# Patient Record
Sex: Male | Born: 1955 | Race: Black or African American | Hispanic: No | Marital: Single | Smoking: Never smoker
Health system: Southern US, Community
[De-identification: ages and names within clinical notes are randomized; demographics above are authoritative.]

## PROBLEM LIST (undated history)

## (undated) DIAGNOSIS — R972 Elevated prostate specific antigen [PSA]: Secondary | ICD-10-CM

## (undated) DIAGNOSIS — N189 Chronic kidney disease, unspecified: Secondary | ICD-10-CM

## (undated) DIAGNOSIS — M1990 Unspecified osteoarthritis, unspecified site: Secondary | ICD-10-CM

## (undated) DIAGNOSIS — Z8489 Family history of other specified conditions: Secondary | ICD-10-CM

## (undated) DIAGNOSIS — C801 Malignant (primary) neoplasm, unspecified: Secondary | ICD-10-CM

## (undated) DIAGNOSIS — E785 Hyperlipidemia, unspecified: Secondary | ICD-10-CM

## (undated) DIAGNOSIS — Z87442 Personal history of urinary calculi: Secondary | ICD-10-CM

## (undated) DIAGNOSIS — I10 Essential (primary) hypertension: Secondary | ICD-10-CM

## (undated) DIAGNOSIS — R7303 Prediabetes: Secondary | ICD-10-CM

## (undated) DIAGNOSIS — E669 Obesity, unspecified: Secondary | ICD-10-CM

## (undated) HISTORY — PX: OTHER SURGICAL HISTORY: SHX169

## (undated) HISTORY — PX: PROSTATE BIOPSY: SHX241

## (undated) HISTORY — PX: TONSILLECTOMY: SUR1361

## (undated) HISTORY — DX: Essential (primary) hypertension: I10

---

## 2008-12-13 DIAGNOSIS — K759 Inflammatory liver disease, unspecified: Secondary | ICD-10-CM

## 2008-12-13 HISTORY — DX: Inflammatory liver disease, unspecified: K75.9

## 2008-12-13 HISTORY — PX: SHOULDER SURGERY: SHX246

## 2010-04-23 ENCOUNTER — Emergency Department (HOSPITAL_COMMUNITY): Admission: EM | Admit: 2010-04-23 | Discharge: 2010-04-23 | Payer: Self-pay | Admitting: Emergency Medicine

## 2012-12-13 HISTORY — PX: OTHER SURGICAL HISTORY: SHX169

## 2013-10-12 ENCOUNTER — Encounter (HOSPITAL_COMMUNITY): Payer: Self-pay | Admitting: Emergency Medicine

## 2013-10-12 ENCOUNTER — Emergency Department (HOSPITAL_COMMUNITY)
Admission: EM | Admit: 2013-10-12 | Discharge: 2013-10-12 | Disposition: A | Discharge status: Home / Self Care | Payer: Managed Care, Other (non HMO) | Source: Home / Self Care | Attending: Family Medicine | Admitting: Family Medicine

## 2013-10-12 DIAGNOSIS — S43499A Other sprain of unspecified shoulder joint, initial encounter: Secondary | ICD-10-CM

## 2013-10-12 DIAGNOSIS — S46811A Strain of other muscles, fascia and tendons at shoulder and upper arm level, right arm, initial encounter: Secondary | ICD-10-CM

## 2013-10-12 MED ORDER — CYCLOBENZAPRINE HCL 5 MG PO TABS: 5.0000 mg | ORAL_TABLET | Freq: Three times a day (TID) | ORAL | Status: DC | PRN

## 2013-10-12 MED ORDER — TRAMADOL HCL 50 MG PO TABS: 50.0000 mg | ORAL_TABLET | Freq: Four times a day (QID) | ORAL | Status: DC | PRN

## 2013-10-12 NOTE — ED Notes (Signed)
C/o neck and shoulder pain which started Tuesday night/wednesday morning.  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  Patient did take aleve and used icy hot but has had no relief.

## 2013-10-12 NOTE — ED Provider Notes (Signed)
CSN: [id number]     Arrival date & time 10/12/13  1114 History   First MD Initiated Contact with Patient 10/12/13 1241     Chief Complaint  Patient presents with  . Neck Pain  . Shoulder Pain   (Consider location/radiation/quality/duration/timing/severity/associated sxs/prior Treatment) Patient is a 57 y.o. male presenting with neck pain and shoulder pain. The history is provided by the patient and the spouse.  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 Worsened by:  Position Associated symptoms: no headaches and no weakness   Shoulder Pain Pertinent negatives include no headaches.    History reviewed. No pertinent past medical history. No past surgical history on file. No family history on file. History  Substance Use Topics  . Smoking status: Not on file  . Smokeless tobacco: Not on file  . Alcohol Use: Not on file    Review of Systems  Constitutional: Negative.   Musculoskeletal: Positive for back pain and neck pain.  Skin: Negative.   Neurological: Negative for weakness and headaches.    Allergies  Review of patient's allergies indicates no known allergies.  Home Medications   Current Outpatient Rx  Name  Route  Sig  Dispense  Refill  . cyclobenzaprine (FLEXERIL) 5 MG tablet   Oral   Take 1 tablet (5 mg total) by mouth 3 (three) times daily as needed for muscle spasms.   30 tablet   0   . traMADol (ULTRAM) 50 MG tablet   Oral   Take 1 tablet (50 mg total) by mouth every 6 (six) hours as needed for pain.   15 tablet   0    BP 165/92  Pulse 76  Temp(Src) 98.2 F (36.8 C) (Oral)  Resp 18  SpO2 97% Physical Exam  Nursing note and vitals reviewed. Constitutional: He is oriented to person, place, and time. He appears  well-developed and well-nourished.  Musculoskeletal: He exhibits tenderness.       Arms: Neurological: He is alert and oriented to person, place, and time.    ED Course  Procedures (including critical care time) Labs Review Labs Reviewed - No data to display Imaging Review No results found.    MDM      Linna Hoff, MD 10/12/13 [phone]

## 2014-08-17 IMAGING — CR DG CERVICAL SPINE COMPLETE 4+V
7 series · 7 of 7 positions shown · non-contrast
Comparison: None.

CLINICAL DATA: Neck pain.

EXAM:
CERVICAL SPINE  4+ VIEWS

[lpo]
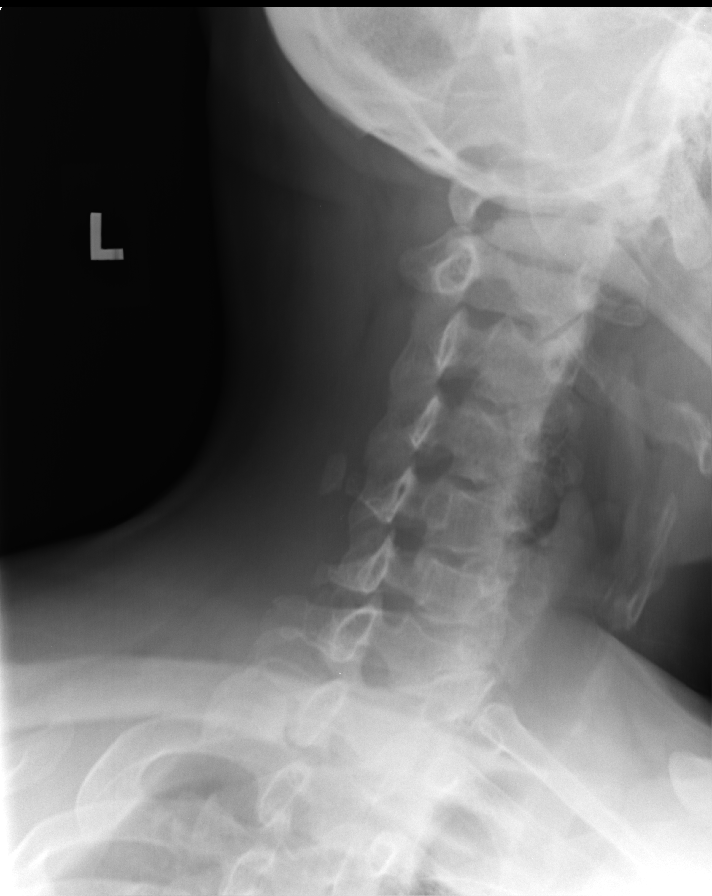

[lateral]
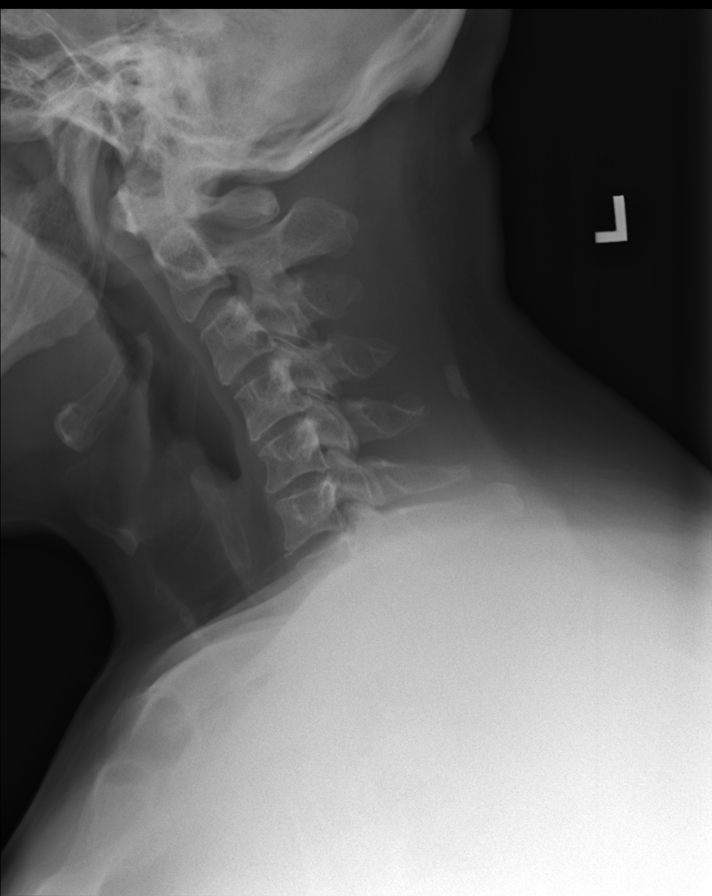

[rpo]
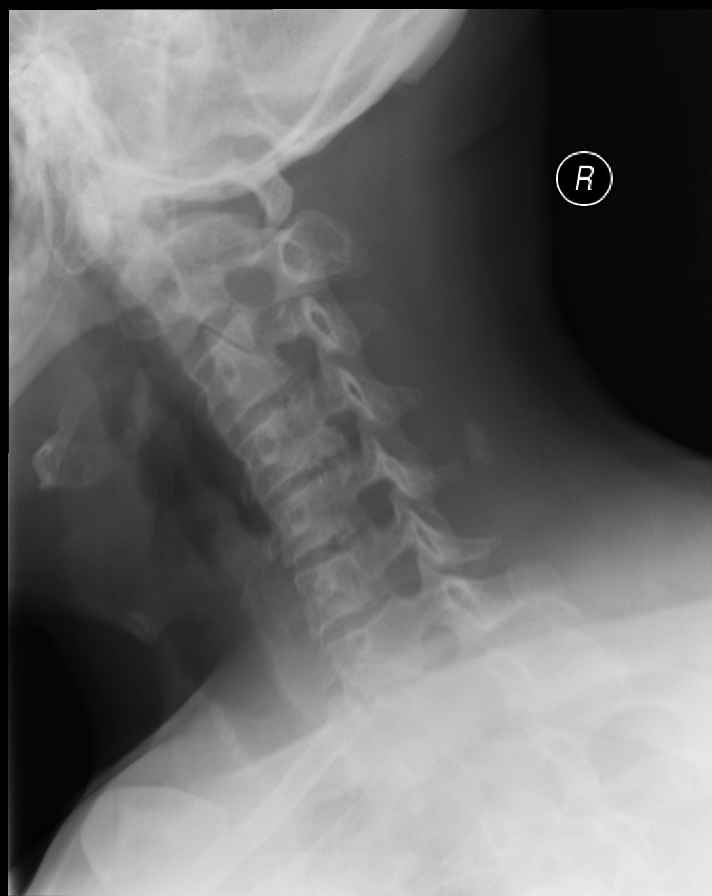

[AP]
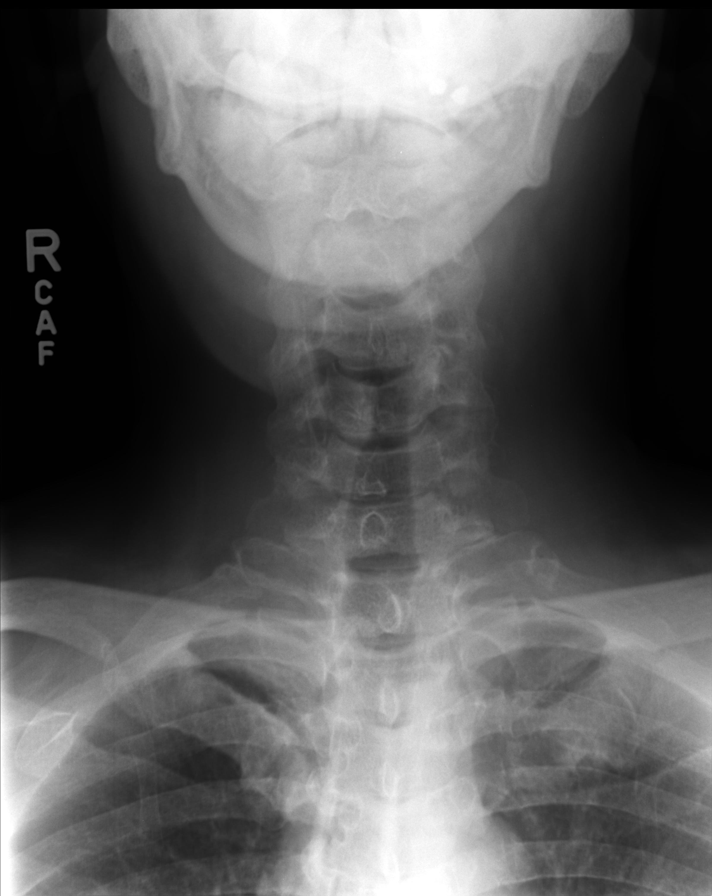

[ap open mouth (1 of 2)]
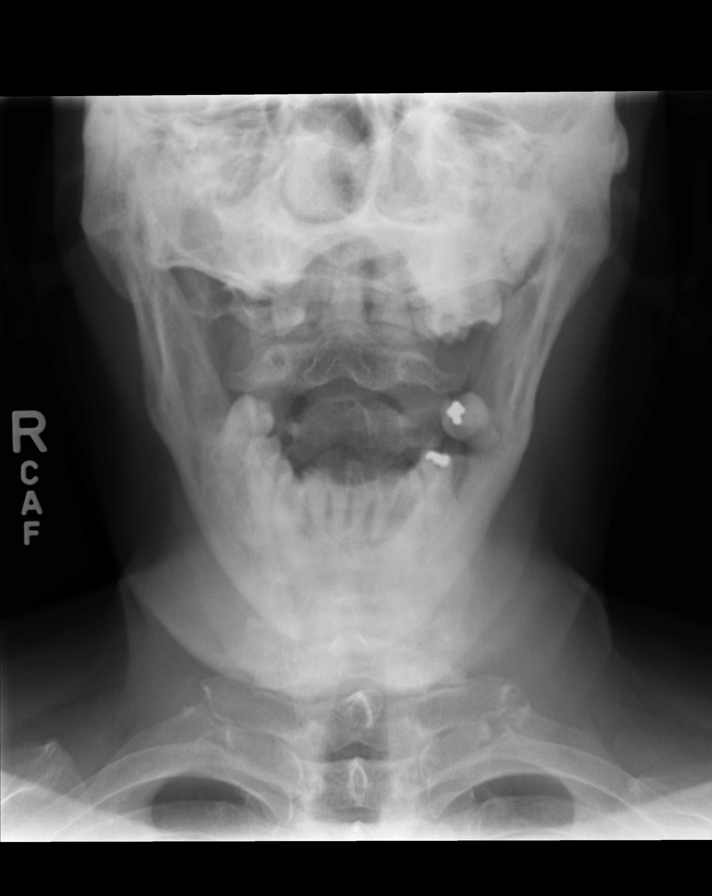

[swimmers]
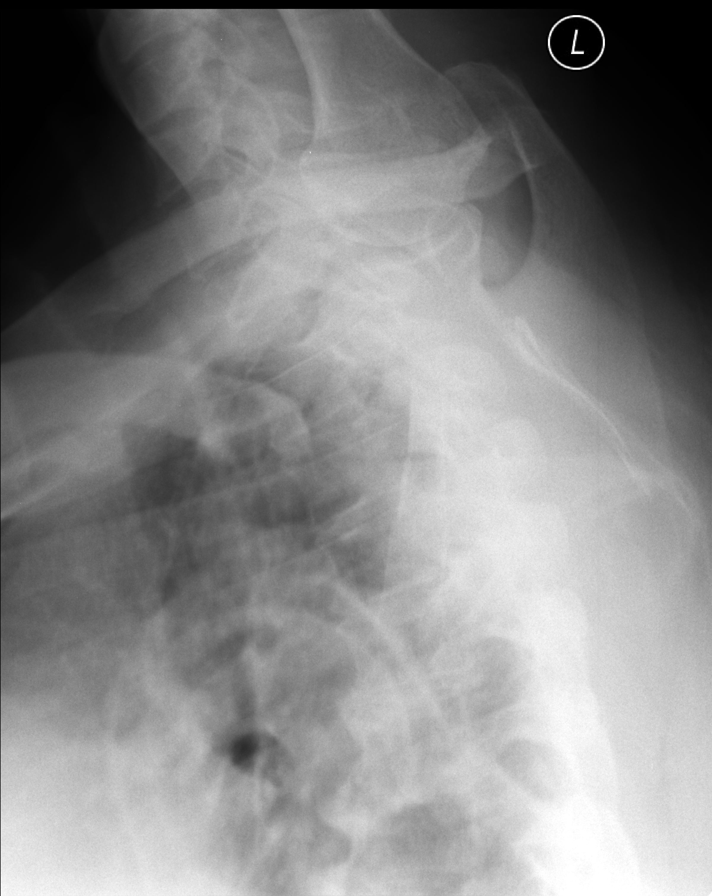

[ap open mouth (2 of 2)]
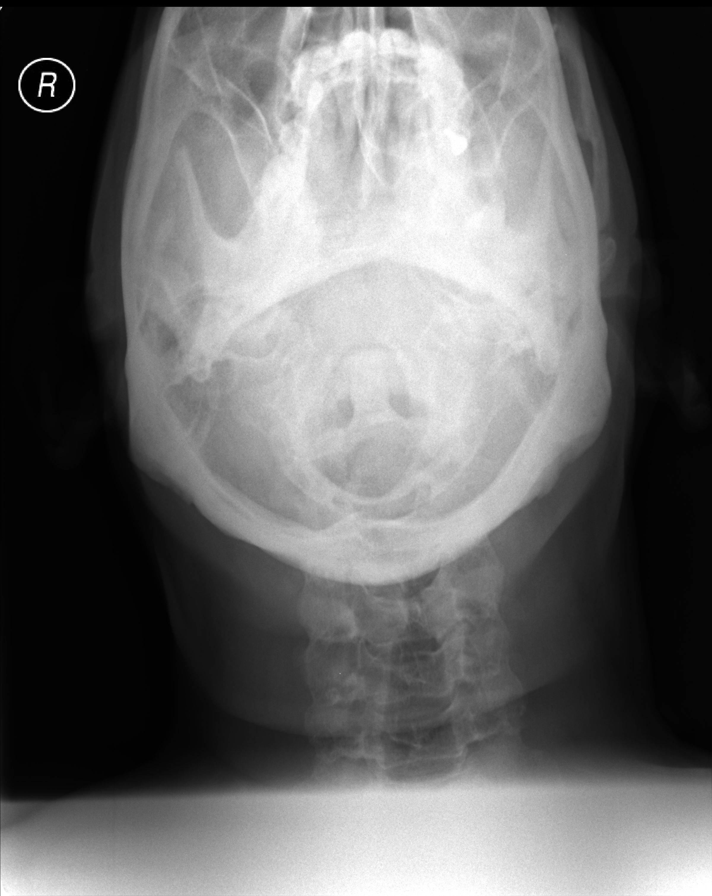

[7 of 7 positions shown; findings below may reference images not displayed]

FINDINGS: Reversal of the normal cervical lordosis centered around C4-C5.
Calcification the nuchal ligament is incidentally noted. Disc space
narrowing at C4-C5 and C5-C6. Mild disk degeneration at C6-C7. There
is no fracture identified. C4-C5 right foraminal stenosis associated
with uncovertebral spurring. Cervicothoracic junction is
suboptimally visualized but grossly appears within normal limits.
IMPRESSION: Moderate mid cervical spondylosis.

## 2014-08-17 IMAGING — CR DG SHOULDER 2+V*R*
3 series · 3 of 3 positions shown · non-contrast
Comparison: None.

CLINICAL DATA: Right shoulder posterior pain.

EXAM:
RIGHT SHOULDER - 2+ VIEW

[AP]
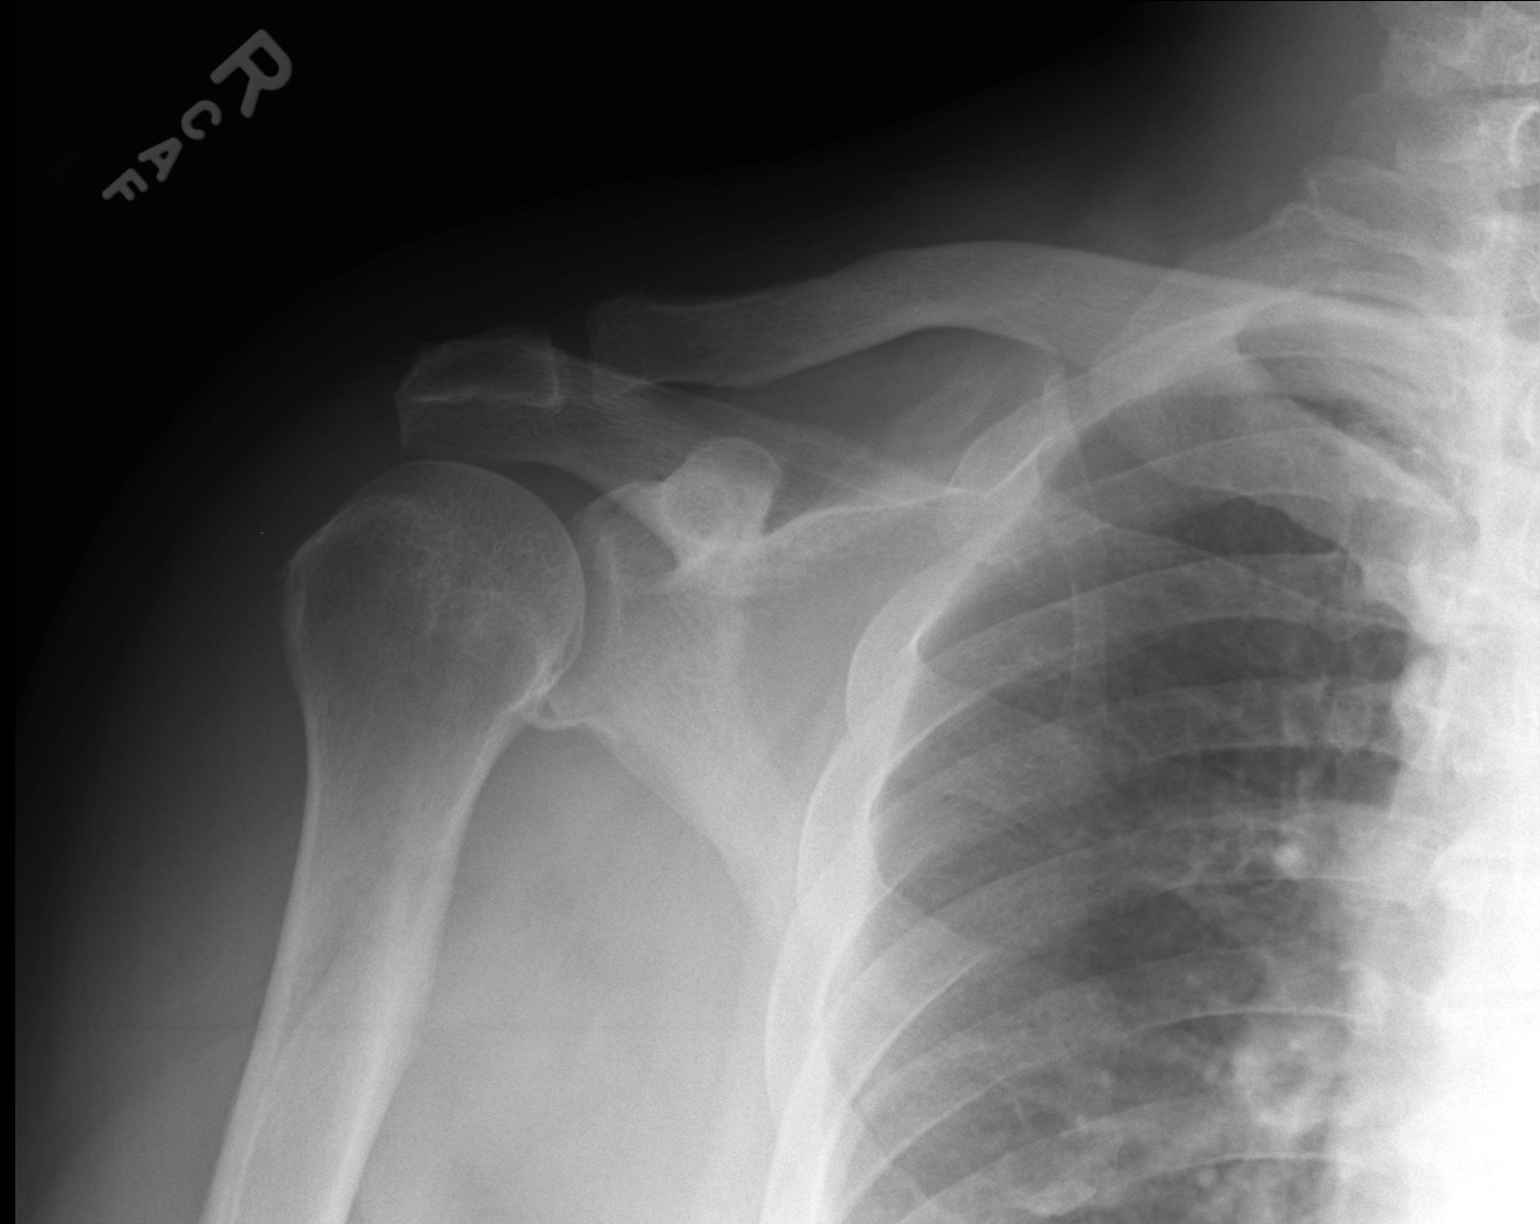

[ap ext rot]
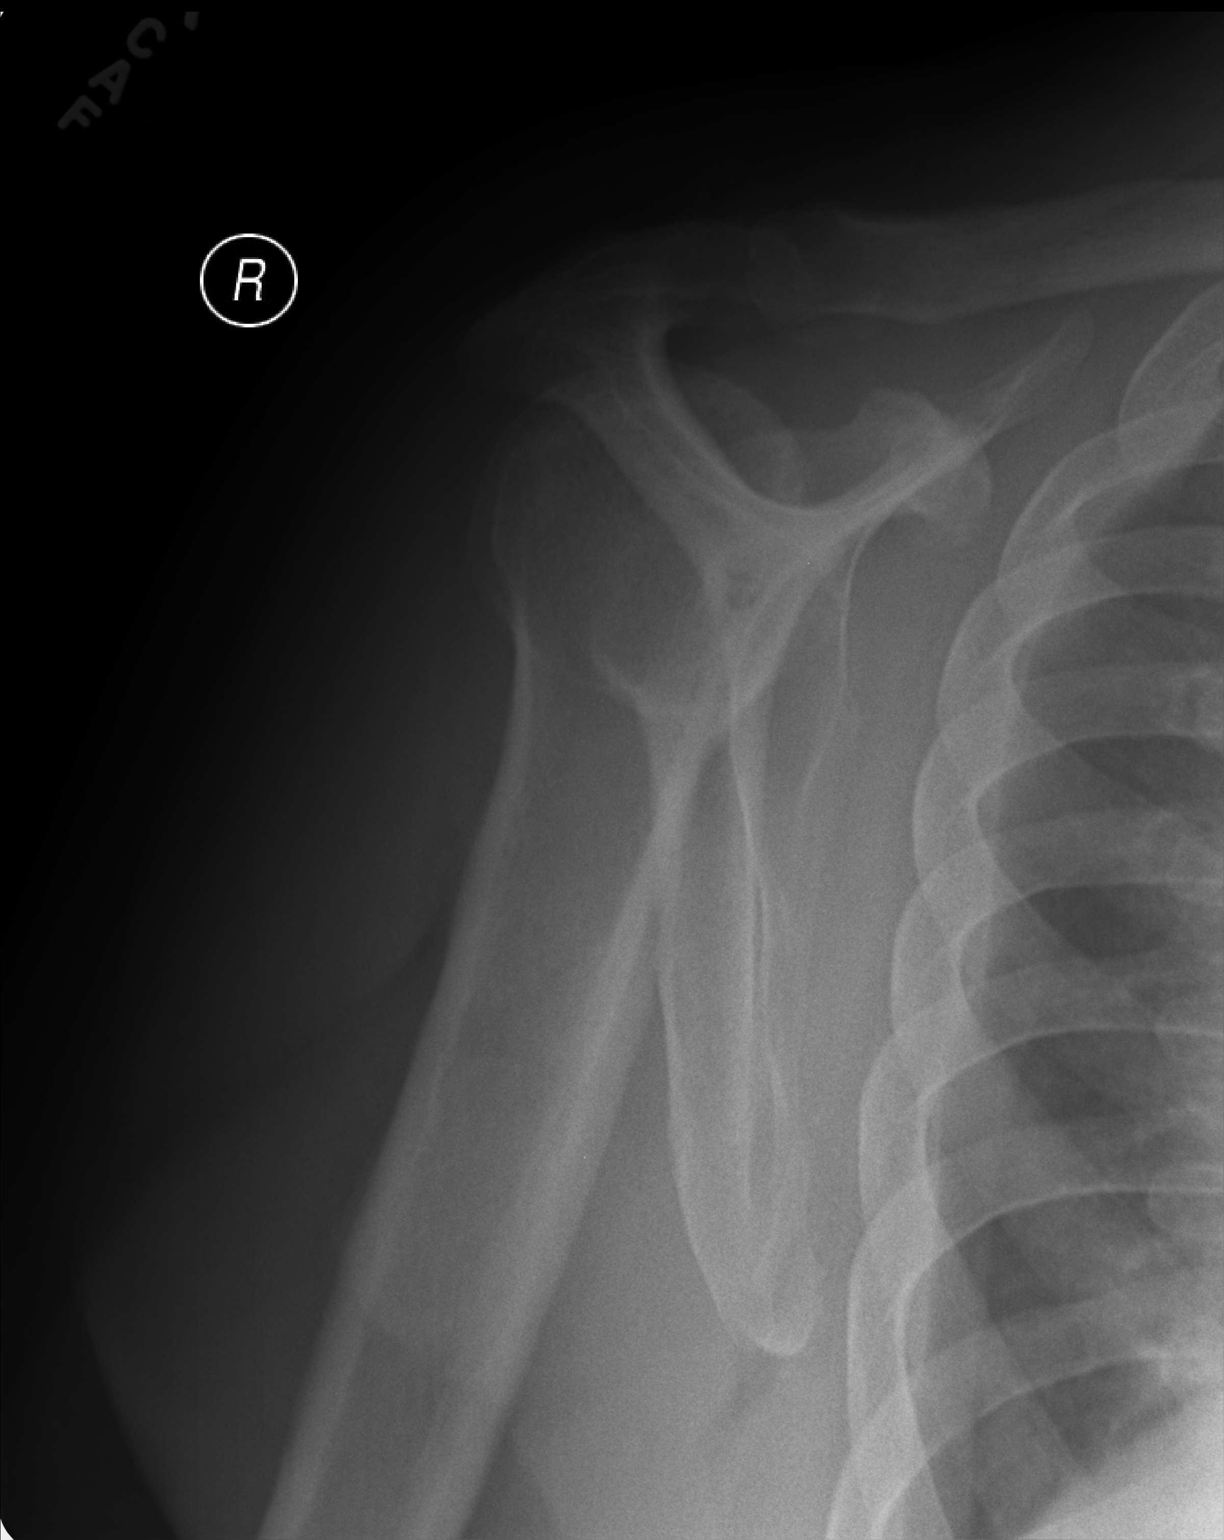

[ap int rot]
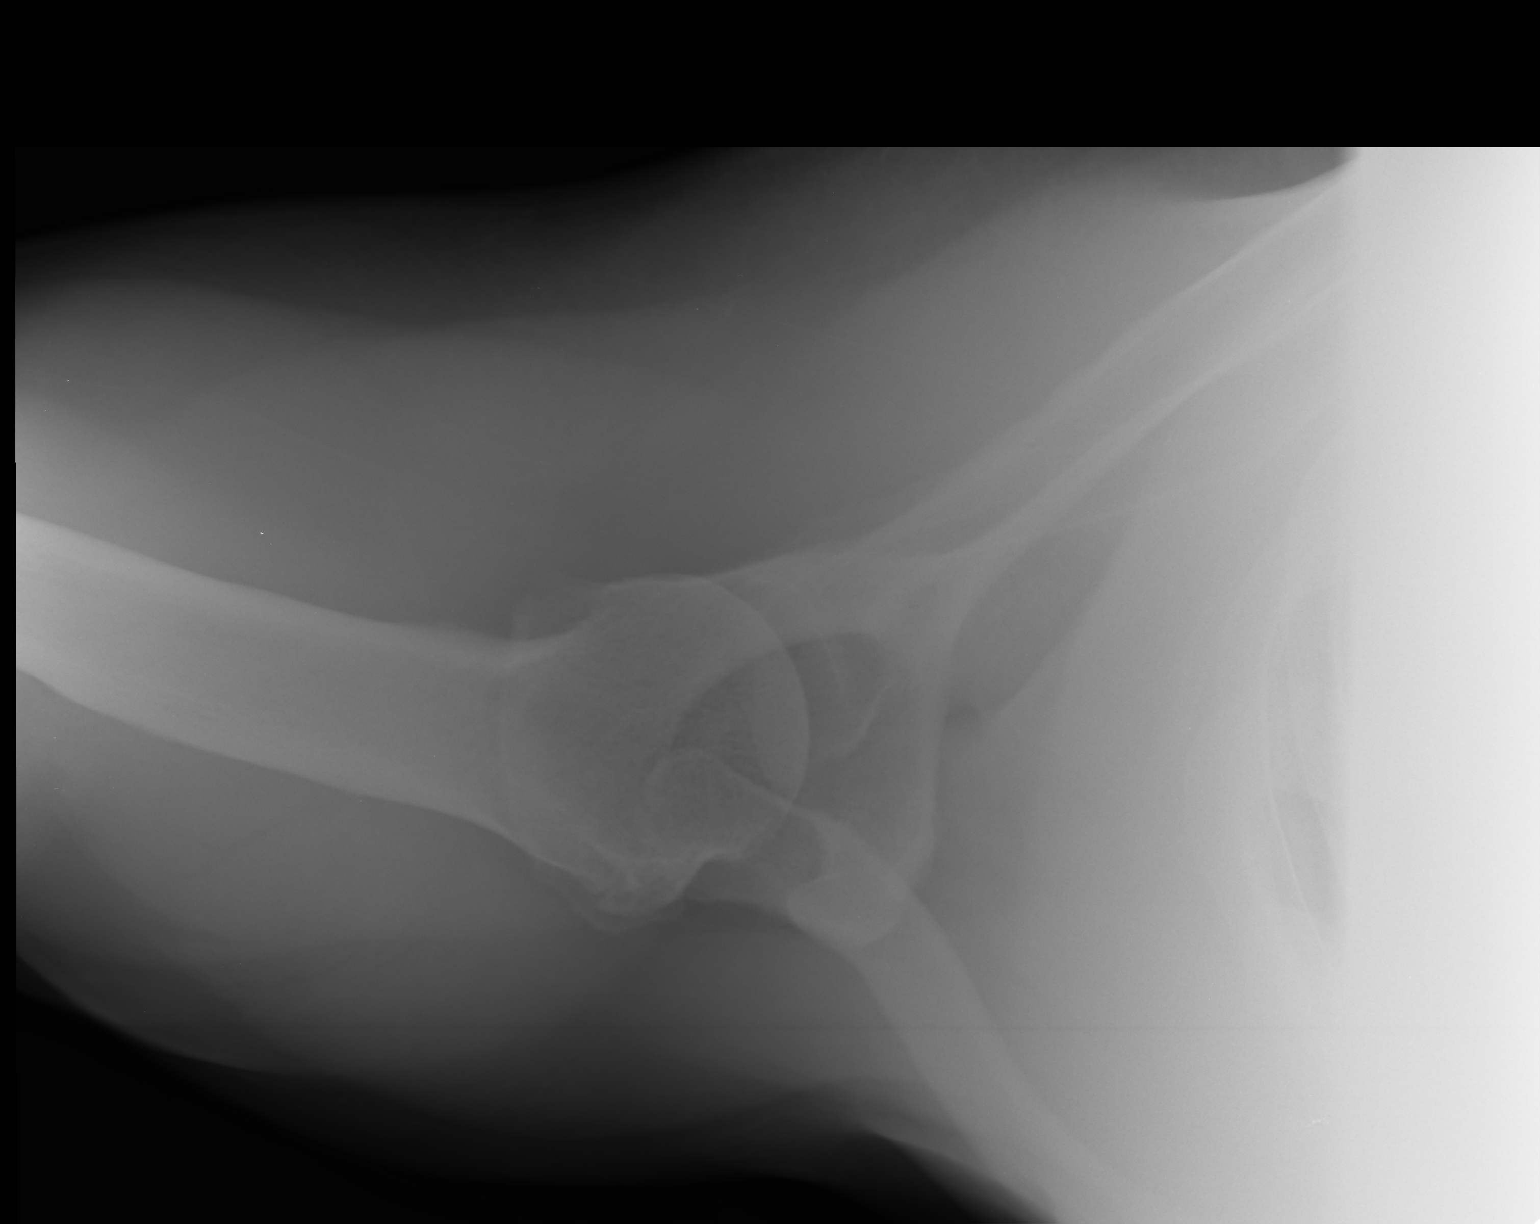

[3 of 3 positions shown; findings below may reference images not displayed]

FINDINGS: There is no evidence of fracture or dislocation. No significant
focal bony abnormality. Soft tissues are unremarkable.
IMPRESSION: Negative.

## 2015-05-02 ENCOUNTER — Ambulatory Visit (INDEPENDENT_AMBULATORY_CARE_PROVIDER_SITE_OTHER): Payer: Worker's Compensation | Admitting: Emergency Medicine

## 2015-05-02 ENCOUNTER — Ambulatory Visit: Payer: Worker's Compensation

## 2015-05-02 VITALS — BP 150/90 | HR 100 | Temp 97.9°F | Resp 18 | Ht 69.5 in | Wt 241.2 lb

## 2015-05-02 DIAGNOSIS — M25512 Pain in left shoulder: Secondary | ICD-10-CM

## 2015-05-02 NOTE — Progress Notes (Addendum)
   Subjective:    Patient ID: Joseph Cox, male    DOB: 1956/05/05, 59 y.o.   MRN: [id number]  This chart was scribed for Collene GobbleSteven A Aldeen Riga, MD by Ronney LionSuzanne Le, ED Scribe. This patient was seen in room 13 and the patient's care was started at 6:08 PM.  HPI   Chief Complaint  Patient presents with  . Shoulder Pain    Left shoulder, fell at work back on march,     HPI Comments: Joseph Cox is a 59 y.o. male who presents to the [facility] complaining of constant left shoulder pain that began about 2 months ago after patient slipped at work on the wet floor coming out if the men's restroom and fell directly on his left shoulder. Patient states that he reported this injury this morning; he had reported the injury earlier, but his boss did not seem to care. He decided to see if his shoulder would improve with time, but it has not gotten any better. He has not had any prior treatment for this. 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 He notes he has a history of surgery on his right shoulder that was performed by Dr. Ave Filterhandler.   Review of Systems  Musculoskeletal: Positive for arthralgias.      Objective:   Physical Exam  Nursing note and vitals reviewed. CONSTITUTIONAL: Well developed/well nourished HEAD: Normocephalic/atraumatic EYES: EOMI/PERRL ENMT: Mucous membranes moist NECK: supple no meningeal signs SPINE/BACK:entire spine nontender CV: S1/S2 noted, no murmurs/rubs/gallops noted LUNGS: Lungs are clear to auscultation bilaterally, no apparent distress ABDOMEN: soft, nontender, no rebound or guarding, bowel sounds noted throughout abdomen GU:no cva tenderness NEURO: Pt is awake/alert/appropriate, moves all extremitiesx4.  No facial droop.   EXTREMITIES: left shoulder - very limited internal/external rotation. Weak external rotation of the left shoulder. Weak empty can test of left arm. Good grip strength.  SKIN: warm, color normal PSYCH: no abnormalities of mood  noted, alert and oriented to situation  [facility] reading (PRIMARY) by  Dr. Cleta Albertsaub No fracture     Assessment & Plan:  Referral made to Dr. Ave Filterhandler. Referral made for MRI of the shoulder. I do suspect he has a rotator cuff tear. He was placed on work restrictions. No medications were given this is a Teacher, adult educationWorker's Comp. Case.I personally performed the services described in this documentation, which was scribed in my presence. The recorded information has been reviewed and is accurate. His restriction is no lifting over 20 pounds no lifting over shoulder height  Earl LitesSteve Yoandri Congrove, MD

## 2015-07-29 ENCOUNTER — Ambulatory Visit (INDEPENDENT_AMBULATORY_CARE_PROVIDER_SITE_OTHER): Payer: Managed Care, Other (non HMO) | Admitting: Emergency Medicine

## 2015-07-29 VITALS — BP 110/76 | HR 80 | Temp 98.3°F | Resp 16 | Ht 69.5 in | Wt 233.0 lb

## 2015-07-29 DIAGNOSIS — M25512 Pain in left shoulder: Secondary | ICD-10-CM | POA: Diagnosis not present

## 2015-07-29 MED ORDER — NAPROXEN SODIUM 550 MG PO TABS
550.0000 mg | ORAL_TABLET | Freq: Two times a day (BID) | ORAL | Status: AC
Start: 2015-07-29 — End: 2016-07-28

## 2015-07-29 NOTE — Progress Notes (Signed)
Subjective:  Patient ID: Joseph Cox, male    DOB: Jan 30, 1956  Age: 59 y.o. MRN: [id number]  CC: Follow-up   HPI Joseph Cox presents  with an injury to his back after slipping on a wet floor at work. He did report the injury to his employer for several months and he was seen here for the injury by Dr. Cleta Alberts. Was put on limited duty. His Workmen's Comp. claim is been denied presumably due to the interval between the injury and reporting. In any event he needs a return to work authorization. He's been working but he needs to have the medical note to close his case. He has little pain in his shoulder and will be having an MRI accomplished to evaluate his injury.  History Joseph Cox has a past medical history of Hypertension.   He has past surgical history that includes shoulder surgery (Left).   His  family history includes Diabetes in his father, mother, and sister; Hypertension in his father and sister.  He   reports that he has never smoked. He does not have any smokeless tobacco history on file. He reports that he does not drink alcohol. His drug history is not on file.  Outpatient Prescriptions Prior to Visit  Medication Sig Dispense Refill  . VALSARTAN PO Take 25 mg by mouth.    . cyclobenzaprine (FLEXERIL) 5 MG tablet Take 1 tablet (5 mg total) by mouth 3 (three) times daily as needed for muscle spasms. (Patient not taking: Reported on 05/02/2015) 30 tablet 0  . traMADol (ULTRAM) 50 MG tablet Take 1 tablet (50 mg total) by mouth every 6 (six) hours as needed for pain. (Patient not taking: Reported on 05/02/2015) 15 tablet 0   No facility-administered medications prior to visit.    Social History   Social History  . Marital Status: Single    Spouse Name: N/A  . Number of Children: N/A  . Years of Education: N/A   Social History Main Topics  . Smoking status: Never Smoker   . Smokeless tobacco: None  . Alcohol Use: No  . Drug Use: None  . Sexual Activity: Not Asked    Other Topics Concern  . None   Social History Narrative     Review of Systems  Constitutional: Negative for fever, chills and appetite change.  HENT: Negative for congestion, ear pain, postnasal drip, sinus pressure and sore throat.   Eyes: Negative for pain and redness.  Respiratory: Negative for cough, shortness of breath and wheezing.   Cardiovascular: Negative for leg swelling.  Gastrointestinal: Negative for nausea, vomiting, abdominal pain, diarrhea, constipation and blood in stool.  Endocrine: Negative for polyuria.  Genitourinary: Negative for dysuria, urgency, frequency and flank pain.  Musculoskeletal: Negative for gait problem.  Skin: Negative for rash.  Neurological: Negative for weakness and headaches.  Psychiatric/Behavioral: Negative for confusion and decreased concentration. The patient is not nervous/anxious.     Objective:  BP 110/76 mmHg  Pulse 80  Temp(Src) 98.3 F (36.8 C) (Oral)  Resp 16  Ht 5' 9.5" (1.765 m)  Wt 233 lb (105.688 kg)  BMI 33.93 kg/m2  SpO2 97%  Physical Exam  Constitutional: He is oriented to person, place, and time. He appears well-developed and well-nourished.  HENT:  Head: Normocephalic and atraumatic.  Eyes: Conjunctivae are normal. Pupils are equal, round, and reactive to light.  Pulmonary/Chest: Effort normal.  Musculoskeletal: He exhibits no edema.       Left shoulder: He exhibits tenderness.  Neurological: He is alert and oriented to person, place, and time.  Skin: Skin is dry.  Psychiatric: He has a normal mood and affect. His behavior is normal. Thought content normal.      Assessment & Plan:   Joseph Cox was seen today for follow-up.  Diagnoses and all orders for this visit:  Pain in joint, shoulder region, left  Other orders -     naproxen sodium (ANAPROX DS) 550 MG tablet; Take 1 tablet (550 mg total) by mouth 2 (two) times daily with a meal.   I am having Joseph Cox start on naproxen sodium. I am also having  him maintain his traMADol, cyclobenzaprine, and VALSARTAN PO.  Meds ordered this encounter  Medications  . naproxen sodium (ANAPROX DS) 550 MG tablet    Sig: Take 1 tablet (550 mg total) by mouth 2 (two) times daily with a meal.    Dispense:  40 tablet    Refill:  0    Appropriate red flag conditions were discussed with the patient as well as actions that should be taken.  Patient expressed his understanding.  Follow-up: Return if symptoms worsen or fail to improve.  Carmelina Dane, MD

## 2015-07-29 NOTE — Patient Instructions (Signed)

## 2015-08-08 ENCOUNTER — Telehealth: Payer: Self-pay | Admitting: Family Medicine

## 2015-08-08 NOTE — Telephone Encounter (Signed)
Per Rosann Auerbach they did not approve for MRI. I spoke to patient and he went to [facility] on 07/24/2015 and saw Gerlene Burdock. Another MRI request was sent to [facility] and an Approval was received on 07/29/2015. He will continue care with [facility]. [employer] forms are sent for scan.

## 2018-09-26 DIAGNOSIS — Z0000 Encounter for general adult medical examination without abnormal findings: Secondary | ICD-10-CM | POA: Diagnosis not present

## 2018-09-26 DIAGNOSIS — E7849 Other hyperlipidemia: Secondary | ICD-10-CM | POA: Diagnosis not present

## 2018-09-26 DIAGNOSIS — R7301 Impaired fasting glucose: Secondary | ICD-10-CM | POA: Diagnosis not present

## 2018-09-26 DIAGNOSIS — E0590 Thyrotoxicosis, unspecified without thyrotoxic crisis or storm: Secondary | ICD-10-CM | POA: Diagnosis not present

## 2018-09-26 DIAGNOSIS — Z125 Encounter for screening for malignant neoplasm of prostate: Secondary | ICD-10-CM | POA: Diagnosis not present

## 2018-09-26 DIAGNOSIS — I10 Essential (primary) hypertension: Secondary | ICD-10-CM | POA: Diagnosis not present

## 2018-10-03 DIAGNOSIS — Z6836 Body mass index (BMI) 36.0-36.9, adult: Secondary | ICD-10-CM | POA: Diagnosis not present

## 2018-10-03 DIAGNOSIS — Z0000 Encounter for general adult medical examination without abnormal findings: Secondary | ICD-10-CM | POA: Diagnosis not present

## 2018-10-03 DIAGNOSIS — Z1389 Encounter for screening for other disorder: Secondary | ICD-10-CM | POA: Diagnosis not present

## 2018-10-03 DIAGNOSIS — N183 Chronic kidney disease, stage 3 (moderate): Secondary | ICD-10-CM | POA: Diagnosis not present

## 2018-10-03 DIAGNOSIS — R7301 Impaired fasting glucose: Secondary | ICD-10-CM | POA: Diagnosis not present

## 2018-10-03 DIAGNOSIS — M25512 Pain in left shoulder: Secondary | ICD-10-CM | POA: Diagnosis not present

## 2018-10-03 DIAGNOSIS — I10 Essential (primary) hypertension: Secondary | ICD-10-CM | POA: Diagnosis not present

## 2018-10-03 DIAGNOSIS — N390 Urinary tract infection, site not specified: Secondary | ICD-10-CM | POA: Diagnosis not present

## 2018-10-03 DIAGNOSIS — E7849 Other hyperlipidemia: Secondary | ICD-10-CM | POA: Diagnosis not present

## 2019-01-30 DIAGNOSIS — I10 Essential (primary) hypertension: Secondary | ICD-10-CM | POA: Diagnosis not present

## 2019-02-06 DIAGNOSIS — R7301 Impaired fasting glucose: Secondary | ICD-10-CM | POA: Diagnosis not present

## 2019-02-22 DIAGNOSIS — Z6836 Body mass index (BMI) 36.0-36.9, adult: Secondary | ICD-10-CM | POA: Diagnosis not present

## 2019-02-22 DIAGNOSIS — N183 Chronic kidney disease, stage 3 (moderate): Secondary | ICD-10-CM | POA: Diagnosis not present

## 2019-02-22 DIAGNOSIS — M1990 Unspecified osteoarthritis, unspecified site: Secondary | ICD-10-CM | POA: Diagnosis not present

## 2019-02-22 DIAGNOSIS — R7301 Impaired fasting glucose: Secondary | ICD-10-CM | POA: Diagnosis not present

## 2019-02-22 DIAGNOSIS — I10 Essential (primary) hypertension: Secondary | ICD-10-CM | POA: Diagnosis not present

## 2019-07-24 DIAGNOSIS — R7301 Impaired fasting glucose: Secondary | ICD-10-CM | POA: Diagnosis not present

## 2019-07-24 DIAGNOSIS — N183 Chronic kidney disease, stage 3 (moderate): Secondary | ICD-10-CM | POA: Diagnosis not present

## 2019-07-24 DIAGNOSIS — S46001D Unspecified injury of muscle(s) and tendon(s) of the rotator cuff of right shoulder, subsequent encounter: Secondary | ICD-10-CM | POA: Diagnosis not present

## 2019-07-24 DIAGNOSIS — I129 Hypertensive chronic kidney disease with stage 1 through stage 4 chronic kidney disease, or unspecified chronic kidney disease: Secondary | ICD-10-CM | POA: Diagnosis not present

## 2019-10-03 DIAGNOSIS — R7301 Impaired fasting glucose: Secondary | ICD-10-CM | POA: Diagnosis not present

## 2019-10-03 DIAGNOSIS — R82998 Other abnormal findings in urine: Secondary | ICD-10-CM | POA: Diagnosis not present

## 2019-10-03 DIAGNOSIS — E7849 Other hyperlipidemia: Secondary | ICD-10-CM | POA: Diagnosis not present

## 2019-10-03 DIAGNOSIS — Z125 Encounter for screening for malignant neoplasm of prostate: Secondary | ICD-10-CM | POA: Diagnosis not present

## 2019-10-10 DIAGNOSIS — I129 Hypertensive chronic kidney disease with stage 1 through stage 4 chronic kidney disease, or unspecified chronic kidney disease: Secondary | ICD-10-CM | POA: Diagnosis not present

## 2019-10-10 DIAGNOSIS — E785 Hyperlipidemia, unspecified: Secondary | ICD-10-CM | POA: Diagnosis not present

## 2019-10-10 DIAGNOSIS — M1990 Unspecified osteoarthritis, unspecified site: Secondary | ICD-10-CM | POA: Diagnosis not present

## 2019-10-10 DIAGNOSIS — Z0000 Encounter for general adult medical examination without abnormal findings: Secondary | ICD-10-CM | POA: Diagnosis not present

## 2019-10-10 DIAGNOSIS — N183 Chronic kidney disease, stage 3 unspecified: Secondary | ICD-10-CM | POA: Diagnosis not present

## 2019-10-10 DIAGNOSIS — R7301 Impaired fasting glucose: Secondary | ICD-10-CM | POA: Diagnosis not present

## 2019-10-10 DIAGNOSIS — R972 Elevated prostate specific antigen [PSA]: Secondary | ICD-10-CM | POA: Diagnosis not present

## 2019-10-10 DIAGNOSIS — Z1331 Encounter for screening for depression: Secondary | ICD-10-CM | POA: Diagnosis not present

## 2019-11-27 DIAGNOSIS — Z1212 Encounter for screening for malignant neoplasm of rectum: Secondary | ICD-10-CM | POA: Diagnosis not present

## 2020-02-14 DIAGNOSIS — N183 Chronic kidney disease, stage 3 unspecified: Secondary | ICD-10-CM | POA: Diagnosis not present

## 2020-02-14 DIAGNOSIS — E785 Hyperlipidemia, unspecified: Secondary | ICD-10-CM | POA: Diagnosis not present

## 2020-02-14 DIAGNOSIS — R972 Elevated prostate specific antigen [PSA]: Secondary | ICD-10-CM | POA: Diagnosis not present

## 2020-02-14 DIAGNOSIS — I129 Hypertensive chronic kidney disease with stage 1 through stage 4 chronic kidney disease, or unspecified chronic kidney disease: Secondary | ICD-10-CM | POA: Diagnosis not present

## 2020-02-14 DIAGNOSIS — M1990 Unspecified osteoarthritis, unspecified site: Secondary | ICD-10-CM | POA: Diagnosis not present

## 2020-02-14 DIAGNOSIS — R7301 Impaired fasting glucose: Secondary | ICD-10-CM | POA: Diagnosis not present

## 2020-02-15 DIAGNOSIS — I129 Hypertensive chronic kidney disease with stage 1 through stage 4 chronic kidney disease, or unspecified chronic kidney disease: Secondary | ICD-10-CM | POA: Diagnosis not present

## 2020-02-15 DIAGNOSIS — R7301 Impaired fasting glucose: Secondary | ICD-10-CM | POA: Diagnosis not present

## 2020-02-15 DIAGNOSIS — R972 Elevated prostate specific antigen [PSA]: Secondary | ICD-10-CM | POA: Diagnosis not present

## 2020-06-19 DIAGNOSIS — R972 Elevated prostate specific antigen [PSA]: Secondary | ICD-10-CM | POA: Diagnosis not present

## 2020-06-19 DIAGNOSIS — N183 Chronic kidney disease, stage 3 unspecified: Secondary | ICD-10-CM | POA: Diagnosis not present

## 2020-06-19 DIAGNOSIS — I129 Hypertensive chronic kidney disease with stage 1 through stage 4 chronic kidney disease, or unspecified chronic kidney disease: Secondary | ICD-10-CM | POA: Diagnosis not present

## 2020-06-19 DIAGNOSIS — R7301 Impaired fasting glucose: Secondary | ICD-10-CM | POA: Diagnosis not present

## 2020-10-03 DIAGNOSIS — I10 Essential (primary) hypertension: Secondary | ICD-10-CM | POA: Diagnosis not present

## 2020-10-03 DIAGNOSIS — Z125 Encounter for screening for malignant neoplasm of prostate: Secondary | ICD-10-CM | POA: Diagnosis not present

## 2020-10-03 DIAGNOSIS — E0590 Thyrotoxicosis, unspecified without thyrotoxic crisis or storm: Secondary | ICD-10-CM | POA: Diagnosis not present

## 2020-10-03 DIAGNOSIS — R7301 Impaired fasting glucose: Secondary | ICD-10-CM | POA: Diagnosis not present

## 2020-10-03 DIAGNOSIS — E785 Hyperlipidemia, unspecified: Secondary | ICD-10-CM | POA: Diagnosis not present

## 2020-10-03 DIAGNOSIS — Z0000 Encounter for general adult medical examination without abnormal findings: Secondary | ICD-10-CM | POA: Diagnosis not present

## 2020-10-10 DIAGNOSIS — I129 Hypertensive chronic kidney disease with stage 1 through stage 4 chronic kidney disease, or unspecified chronic kidney disease: Secondary | ICD-10-CM | POA: Diagnosis not present

## 2020-10-10 DIAGNOSIS — Z0000 Encounter for general adult medical examination without abnormal findings: Secondary | ICD-10-CM | POA: Diagnosis not present

## 2020-10-10 DIAGNOSIS — N183 Chronic kidney disease, stage 3 unspecified: Secondary | ICD-10-CM | POA: Diagnosis not present

## 2020-10-10 DIAGNOSIS — M1990 Unspecified osteoarthritis, unspecified site: Secondary | ICD-10-CM | POA: Diagnosis not present

## 2020-10-10 DIAGNOSIS — R7301 Impaired fasting glucose: Secondary | ICD-10-CM | POA: Diagnosis not present

## 2020-10-10 DIAGNOSIS — R82998 Other abnormal findings in urine: Secondary | ICD-10-CM | POA: Diagnosis not present

## 2020-10-10 DIAGNOSIS — E785 Hyperlipidemia, unspecified: Secondary | ICD-10-CM | POA: Diagnosis not present

## 2020-10-10 DIAGNOSIS — R972 Elevated prostate specific antigen [PSA]: Secondary | ICD-10-CM | POA: Diagnosis not present

## 2020-11-12 DIAGNOSIS — N400 Enlarged prostate without lower urinary tract symptoms: Secondary | ICD-10-CM | POA: Diagnosis not present

## 2020-11-12 DIAGNOSIS — R972 Elevated prostate specific antigen [PSA]: Secondary | ICD-10-CM | POA: Diagnosis not present

## 2021-03-03 DIAGNOSIS — M1990 Unspecified osteoarthritis, unspecified site: Secondary | ICD-10-CM | POA: Diagnosis not present

## 2021-03-03 DIAGNOSIS — N183 Chronic kidney disease, stage 3 unspecified: Secondary | ICD-10-CM | POA: Diagnosis not present

## 2021-03-03 DIAGNOSIS — E785 Hyperlipidemia, unspecified: Secondary | ICD-10-CM | POA: Diagnosis not present

## 2021-03-03 DIAGNOSIS — R7301 Impaired fasting glucose: Secondary | ICD-10-CM | POA: Diagnosis not present

## 2021-03-03 DIAGNOSIS — I129 Hypertensive chronic kidney disease with stage 1 through stage 4 chronic kidney disease, or unspecified chronic kidney disease: Secondary | ICD-10-CM | POA: Diagnosis not present

## 2021-03-03 DIAGNOSIS — R972 Elevated prostate specific antigen [PSA]: Secondary | ICD-10-CM | POA: Diagnosis not present

## 2021-03-03 DIAGNOSIS — Z23 Encounter for immunization: Secondary | ICD-10-CM | POA: Diagnosis not present

## 2021-05-13 DIAGNOSIS — R972 Elevated prostate specific antigen [PSA]: Secondary | ICD-10-CM | POA: Diagnosis not present

## 2021-05-20 DIAGNOSIS — R972 Elevated prostate specific antigen [PSA]: Secondary | ICD-10-CM | POA: Diagnosis not present

## 2021-05-20 DIAGNOSIS — N400 Enlarged prostate without lower urinary tract symptoms: Secondary | ICD-10-CM | POA: Diagnosis not present

## 2021-07-22 DIAGNOSIS — E785 Hyperlipidemia, unspecified: Secondary | ICD-10-CM | POA: Diagnosis not present

## 2021-07-22 DIAGNOSIS — N183 Chronic kidney disease, stage 3 unspecified: Secondary | ICD-10-CM | POA: Diagnosis not present

## 2021-07-22 DIAGNOSIS — M1990 Unspecified osteoarthritis, unspecified site: Secondary | ICD-10-CM | POA: Diagnosis not present

## 2021-07-22 DIAGNOSIS — R7301 Impaired fasting glucose: Secondary | ICD-10-CM | POA: Diagnosis not present

## 2021-07-22 DIAGNOSIS — I129 Hypertensive chronic kidney disease with stage 1 through stage 4 chronic kidney disease, or unspecified chronic kidney disease: Secondary | ICD-10-CM | POA: Diagnosis not present

## 2021-07-22 DIAGNOSIS — R972 Elevated prostate specific antigen [PSA]: Secondary | ICD-10-CM | POA: Diagnosis not present

## 2021-11-03 DIAGNOSIS — E785 Hyperlipidemia, unspecified: Secondary | ICD-10-CM | POA: Diagnosis not present

## 2021-11-03 DIAGNOSIS — E0590 Thyrotoxicosis, unspecified without thyrotoxic crisis or storm: Secondary | ICD-10-CM | POA: Diagnosis not present

## 2021-11-03 DIAGNOSIS — Z125 Encounter for screening for malignant neoplasm of prostate: Secondary | ICD-10-CM | POA: Diagnosis not present

## 2021-11-03 DIAGNOSIS — I10 Essential (primary) hypertension: Secondary | ICD-10-CM | POA: Diagnosis not present

## 2021-11-03 DIAGNOSIS — R7301 Impaired fasting glucose: Secondary | ICD-10-CM | POA: Diagnosis not present

## 2021-11-10 DIAGNOSIS — R972 Elevated prostate specific antigen [PSA]: Secondary | ICD-10-CM | POA: Diagnosis not present

## 2021-11-10 DIAGNOSIS — R7301 Impaired fasting glucose: Secondary | ICD-10-CM | POA: Diagnosis not present

## 2021-11-10 DIAGNOSIS — M1990 Unspecified osteoarthritis, unspecified site: Secondary | ICD-10-CM | POA: Diagnosis not present

## 2021-11-10 DIAGNOSIS — R82998 Other abnormal findings in urine: Secondary | ICD-10-CM | POA: Diagnosis not present

## 2021-11-10 DIAGNOSIS — E785 Hyperlipidemia, unspecified: Secondary | ICD-10-CM | POA: Diagnosis not present

## 2021-11-10 DIAGNOSIS — Z0000 Encounter for general adult medical examination without abnormal findings: Secondary | ICD-10-CM | POA: Diagnosis not present

## 2021-11-10 DIAGNOSIS — N183 Chronic kidney disease, stage 3 unspecified: Secondary | ICD-10-CM | POA: Diagnosis not present

## 2021-11-10 DIAGNOSIS — I10 Essential (primary) hypertension: Secondary | ICD-10-CM | POA: Diagnosis not present

## 2021-11-10 DIAGNOSIS — I129 Hypertensive chronic kidney disease with stage 1 through stage 4 chronic kidney disease, or unspecified chronic kidney disease: Secondary | ICD-10-CM | POA: Diagnosis not present

## 2021-11-10 DIAGNOSIS — R945 Abnormal results of liver function studies: Secondary | ICD-10-CM | POA: Diagnosis not present

## 2021-11-13 DIAGNOSIS — R972 Elevated prostate specific antigen [PSA]: Secondary | ICD-10-CM | POA: Diagnosis not present

## 2021-11-19 DIAGNOSIS — R972 Elevated prostate specific antigen [PSA]: Secondary | ICD-10-CM | POA: Diagnosis not present

## 2021-11-19 DIAGNOSIS — N400 Enlarged prostate without lower urinary tract symptoms: Secondary | ICD-10-CM | POA: Diagnosis not present

## 2022-05-17 DIAGNOSIS — R972 Elevated prostate specific antigen [PSA]: Secondary | ICD-10-CM | POA: Diagnosis not present

## 2022-05-24 DIAGNOSIS — R972 Elevated prostate specific antigen [PSA]: Secondary | ICD-10-CM | POA: Diagnosis not present

## 2022-05-24 DIAGNOSIS — N400 Enlarged prostate without lower urinary tract symptoms: Secondary | ICD-10-CM | POA: Diagnosis not present

## 2022-05-31 DIAGNOSIS — R972 Elevated prostate specific antigen [PSA]: Secondary | ICD-10-CM | POA: Diagnosis not present

## 2022-05-31 DIAGNOSIS — E785 Hyperlipidemia, unspecified: Secondary | ICD-10-CM | POA: Diagnosis not present

## 2022-05-31 DIAGNOSIS — R7301 Impaired fasting glucose: Secondary | ICD-10-CM | POA: Diagnosis not present

## 2022-05-31 DIAGNOSIS — M1990 Unspecified osteoarthritis, unspecified site: Secondary | ICD-10-CM | POA: Diagnosis not present

## 2022-05-31 DIAGNOSIS — N183 Chronic kidney disease, stage 3 unspecified: Secondary | ICD-10-CM | POA: Diagnosis not present

## 2022-05-31 DIAGNOSIS — I129 Hypertensive chronic kidney disease with stage 1 through stage 4 chronic kidney disease, or unspecified chronic kidney disease: Secondary | ICD-10-CM | POA: Diagnosis not present

## 2022-05-31 DIAGNOSIS — R945 Abnormal results of liver function studies: Secondary | ICD-10-CM | POA: Diagnosis not present

## 2022-06-16 DIAGNOSIS — Z1211 Encounter for screening for malignant neoplasm of colon: Secondary | ICD-10-CM | POA: Diagnosis not present

## 2022-11-15 DIAGNOSIS — R972 Elevated prostate specific antigen [PSA]: Secondary | ICD-10-CM | POA: Diagnosis not present

## 2022-11-19 DIAGNOSIS — R972 Elevated prostate specific antigen [PSA]: Secondary | ICD-10-CM | POA: Diagnosis not present

## 2022-11-19 DIAGNOSIS — E785 Hyperlipidemia, unspecified: Secondary | ICD-10-CM | POA: Diagnosis not present

## 2022-11-19 DIAGNOSIS — R7301 Impaired fasting glucose: Secondary | ICD-10-CM | POA: Diagnosis not present

## 2022-11-19 DIAGNOSIS — E0590 Thyrotoxicosis, unspecified without thyrotoxic crisis or storm: Secondary | ICD-10-CM | POA: Diagnosis not present

## 2022-11-19 DIAGNOSIS — I10 Essential (primary) hypertension: Secondary | ICD-10-CM | POA: Diagnosis not present

## 2022-11-22 DIAGNOSIS — R972 Elevated prostate specific antigen [PSA]: Secondary | ICD-10-CM | POA: Diagnosis not present

## 2022-11-22 DIAGNOSIS — N400 Enlarged prostate without lower urinary tract symptoms: Secondary | ICD-10-CM | POA: Diagnosis not present

## 2022-11-26 DIAGNOSIS — R972 Elevated prostate specific antigen [PSA]: Secondary | ICD-10-CM | POA: Diagnosis not present

## 2022-11-26 DIAGNOSIS — R82998 Other abnormal findings in urine: Secondary | ICD-10-CM | POA: Diagnosis not present

## 2022-11-26 DIAGNOSIS — M1990 Unspecified osteoarthritis, unspecified site: Secondary | ICD-10-CM | POA: Diagnosis not present

## 2022-11-26 DIAGNOSIS — I129 Hypertensive chronic kidney disease with stage 1 through stage 4 chronic kidney disease, or unspecified chronic kidney disease: Secondary | ICD-10-CM | POA: Diagnosis not present

## 2022-11-26 DIAGNOSIS — N1831 Chronic kidney disease, stage 3a: Secondary | ICD-10-CM | POA: Diagnosis not present

## 2022-11-26 DIAGNOSIS — E785 Hyperlipidemia, unspecified: Secondary | ICD-10-CM | POA: Diagnosis not present

## 2022-11-26 DIAGNOSIS — Z0000 Encounter for general adult medical examination without abnormal findings: Secondary | ICD-10-CM | POA: Diagnosis not present

## 2022-11-26 DIAGNOSIS — R7301 Impaired fasting glucose: Secondary | ICD-10-CM | POA: Diagnosis not present

## 2022-11-26 DIAGNOSIS — R945 Abnormal results of liver function studies: Secondary | ICD-10-CM | POA: Diagnosis not present

## 2023-02-17 ENCOUNTER — Encounter (HOSPITAL_BASED_OUTPATIENT_CLINIC_OR_DEPARTMENT_OTHER): Payer: Self-pay | Admitting: Emergency Medicine

## 2023-02-17 ENCOUNTER — Other Ambulatory Visit: Payer: Self-pay

## 2023-02-17 ENCOUNTER — Emergency Department (HOSPITAL_BASED_OUTPATIENT_CLINIC_OR_DEPARTMENT_OTHER): Payer: Medicare HMO

## 2023-02-17 ENCOUNTER — Emergency Department (HOSPITAL_BASED_OUTPATIENT_CLINIC_OR_DEPARTMENT_OTHER)
Admission: EM | Admit: 2023-02-17 | Discharge: 2023-02-17 | Disposition: A | Discharge status: Home / Self Care | Payer: Medicare HMO | Attending: Emergency Medicine | Admitting: Emergency Medicine

## 2023-02-17 DIAGNOSIS — M79672 Pain in left foot: Secondary | ICD-10-CM | POA: Diagnosis not present

## 2023-02-17 DIAGNOSIS — S86912A Strain of unspecified muscle(s) and tendon(s) at lower leg level, left leg, initial encounter: Secondary | ICD-10-CM

## 2023-02-17 DIAGNOSIS — S96912A Strain of unspecified muscle and tendon at ankle and foot level, left foot, initial encounter: Secondary | ICD-10-CM | POA: Diagnosis not present

## 2023-02-17 DIAGNOSIS — Z79899 Other long term (current) drug therapy: Secondary | ICD-10-CM | POA: Insufficient documentation

## 2023-02-17 DIAGNOSIS — W208XXA Other cause of strike by thrown, projected or falling object, initial encounter: Secondary | ICD-10-CM | POA: Diagnosis not present

## 2023-02-17 DIAGNOSIS — M1712 Unilateral primary osteoarthritis, left knee: Secondary | ICD-10-CM | POA: Diagnosis not present

## 2023-02-17 DIAGNOSIS — S99912A Unspecified injury of left ankle, initial encounter: Secondary | ICD-10-CM | POA: Diagnosis not present

## 2023-02-17 DIAGNOSIS — S838X2A Sprain of other specified parts of left knee, initial encounter: Secondary | ICD-10-CM | POA: Diagnosis not present

## 2023-02-17 DIAGNOSIS — S8392XA Sprain of unspecified site of left knee, initial encounter: Secondary | ICD-10-CM | POA: Insufficient documentation

## 2023-02-17 DIAGNOSIS — I10 Essential (primary) hypertension: Secondary | ICD-10-CM | POA: Diagnosis not present

## 2023-02-17 DIAGNOSIS — S96812A Strain of other specified muscles and tendons at ankle and foot level, left foot, initial encounter: Secondary | ICD-10-CM | POA: Diagnosis not present

## 2023-02-17 MED ORDER — MORPHINE SULFATE (PF) 4 MG/ML IV SOLN
4.0000 mg | Freq: Once | INTRAVENOUS | Status: AC
  Administered 2023-02-17: 4 mg via INTRAVENOUS

## 2023-02-17 MED ORDER — IBUPROFEN 600 MG PO TABS: 600.0000 mg | ORAL_TABLET | Freq: Three times a day (TID) | ORAL | 0 refills | Status: AC | PRN

## 2023-02-17 MED ORDER — MORPHINE SULFATE (PF) 4 MG/ML IV SOLN
4.0000 mg | Freq: Once | INTRAVENOUS | Status: DC
  Filled 2023-02-17: qty 1

## 2023-02-17 NOTE — ED Provider Notes (Signed)
[facility] Provider Note   CSN: [id number] Arrival date & time: 02/17/23  1611     History  Chief Complaint  Patient presents with   Leg Pain    Joseph Cox is a 67 y.o. male with past medical history hypertension who presents to the ED complaining of left knee and left foot pain that started today.  Patient states that today the motor of a lawnmower fell off the machine and hit his knee and his foot.  He has been unable to ambulate since that time.  He denies a prior injury to the left knee or the foot.  He denies numbness, tingling, or weakness to the leg.  He denies fall or other injury.  He has not taken any medication for pain prior to arrival.       Home Medications Prior to Admission medications   Medication Sig Start Date End Date Taking? Authorizing Provider  ibuprofen (ADVIL) 600 MG tablet Take 1 tablet (600 mg total) by mouth every 8 (eight) hours as needed for up to 3 days for mild pain or moderate pain. 02/17/23 02/20/23 Yes Talor Desrosiers L, PA-C  cyclobenzaprine (FLEXERIL) 5 MG tablet Take 1 tablet (5 mg total) by mouth 3 (three) times daily as needed for muscle spasms. Patient not taking: Reported on 05/02/2015 10/12/13   Billy Fischer, MD  traMADol (ULTRAM) 50 MG tablet Take 1 tablet (50 mg total) by mouth every 6 (six) hours as needed for pain. Patient not taking: Reported on 05/02/2015 10/12/13   Billy Fischer, MD  VALSARTAN PO Take 25 mg by mouth.    [provider]      Allergies    Percocet [oxycodone-acetaminophen]    Review of Systems   Review of Systems  All other systems reviewed and are negative.   Physical Exam Updated Vital Signs BP 130/82   Pulse 97   Temp 98.6 F (37 C)   Resp 20   Ht 6' (1.829 m)   Wt 114.3 kg   SpO2 93%   BMI 34.18 kg/m  Physical Exam Vitals and nursing note reviewed.  Constitutional:      General: He is not in acute distress.    Appearance: Normal appearance.  He is not toxic-appearing.  HENT:     Head: Normocephalic and atraumatic.     Mouth/Throat:     Mouth: Mucous membranes are moist.  Eyes:     Conjunctiva/sclera: Conjunctivae normal.  Cardiovascular:     Rate and Rhythm: Regular rhythm. Tachycardia present.     Heart sounds: No murmur heard. Pulmonary:     Effort: Pulmonary effort is normal.     Breath sounds: Normal breath sounds.  Abdominal:     General: Abdomen is flat.     Palpations: Abdomen is soft.     Tenderness: There is no abdominal tenderness.  Musculoskeletal:     Cervical back: Normal range of motion and neck supple.     Right lower leg: No edema.     Left lower leg: No edema.     Comments: Left knee with minimal tenderness over patella but no significant swelling or effusion and no erythema, ecchymosis, or other overlying skin changes, deformity, or other areas of localized tenderness to the knee, range of motion appears intact; no tenderness over the left tibia/fibula or calf; tenderness to both medial and lateral malleoli on the left and minimal tenderness over dorsum of left foot but no significant  swelling or effusion and no erythema, ecchymosis, obvious deformity or other overlying skin changes, dorsiflexion and plantar flexion of ankle and range of motion of foot appears fully intact, 2+ DP and PT pulses, normal sensation, all compartments of LLE soft  Skin:    General: Skin is warm and dry.     Capillary Refill: Capillary refill takes less than 2 seconds.  Neurological:     Mental Status: He is alert. Mental status is at baseline.  Psychiatric:        Behavior: Behavior normal.     ED Results / Procedures / Treatments   Labs (all labs ordered are listed, but only abnormal results are displayed) Labs Reviewed - No data to display  EKG None  Radiology DG Foot Complete Left  Result Date: 02/17/2023 CLINICAL DATA:  Pain. Something heavy fell on left knee and foot, painful weight-bearing. EXAM: LEFT FOOT -  COMPLETE 3+ VIEW COMPARISON:  None Available. FINDINGS: There is no evidence of fracture or dislocation. Mild osteoarthritis of the first metatarsal phalangeal joint. Mild to moderate midfoot osteoarthritis with dorsal spurring. There is a large plantar calcaneal spur and Achilles tendon enthesophyte. Multiple calcifications in the plantar foot in the region of the plantar fascia anterior to the calcaneus. Focal area of soft tissue prominence overlies the dorsum of foot in the region of the metatarsal phalangeal joints, tentatively in the region of the 1st-2nd ray. No radiopaque foreign body. IMPRESSION: 1. No acute fracture or dislocation of the left foot. 2. Focal area of soft tissue prominence in the dorsum of the foot in the region of the metatarsophalangeal joints, tentatively in the region of the 1st-2nd ray. This may represent a hematoma in the setting of injury, recommend correlation with physical exam. 3. Multifocal degenerative change. 4. Soft tissue calcifications in the plantar foot in the region of the plantar fascial, chronic. Electronically Signed   By: Rani Rake M.D.   On: 02/17/2023 17:27   DG Knee Complete 4 Views Left  Result Date: 02/17/2023 CLINICAL DATA:  Pain. Something heavy fell on left knee and foot, painful weight-bearing. EXAM: LEFT KNEE - COMPLETE 4+ VIEW COMPARISON:  None Available. FINDINGS: No acute fracture. No dislocation. There is mild medial tibiofemoral joint space narrowing. Mild tricompartmental peripheral spurring most pronounced in the patellofemoral compartment. Small quadriceps and patellar tendon enthesophytes. Minimal knee joint effusion. No focal soft tissue abnormalities are seen. IMPRESSION: 1. No acute fracture or dislocation. 2. Mild tricompartmental osteoarthritis, most pronounced in the patellofemoral compartment. Minimal knee joint effusion. Electronically Signed   By: Joshia Rake M.D.   On: 02/17/2023 17:24    Procedures Procedures     Medications Ordered in ED Medications  morphine (PF) 4 MG/ML injection 4 mg (4 mg Intravenous Given 02/17/23 1727)    ED Course/ Medical Decision Making/ A&P                             Medical Decision Making Amount and/or Complexity of Data Reviewed Radiology: ordered. Decision-making details documented in ED Course.  Risk Prescription drug management.   Medical Decision Making:   Joseph Cox is a 67 y.o. male who presented to the ED today with L knee and foot pain detailed above.    Additional history discussed with patient's family/caregivers.  Complete initial physical exam performed, notably the patient was mildly tachycardic but in no acute distress.  He did have tenderness over the left anterior knee  and dorsum of the left foot but no obvious deformity, no skin changes, and patient was neurovascularly intact distally with soft compartments.  Reviewed and confirmed nursing documentation for past medical history, family history, social history.    Initial Assessment:   With the patient's presentation of knee and foot pain, most likely diagnosis is sprains. Differential diagnosis includes but is not limited to fracture, dislocation, strain, compartment syndrome. This is most consistent with an acute complicated illness  Initial Plan:  X-ray left foot and left knee to evaluate for bony pathology Pain management Objective evaluation as reviewed   Initial Study Results:    Radiology:  All images reviewed independently. Agree with radiology report at this time.   DG Foot Complete Left  Result Date: 02/17/2023 CLINICAL DATA:  Pain. Something heavy fell on left knee and foot, painful weight-bearing. EXAM: LEFT FOOT - COMPLETE 3+ VIEW COMPARISON:  None Available. FINDINGS: There is no evidence of fracture or dislocation. Mild osteoarthritis of the first metatarsal phalangeal joint. Mild to moderate midfoot osteoarthritis with dorsal spurring. There is a large plantar calcaneal  spur and Achilles tendon enthesophyte. Multiple calcifications in the plantar foot in the region of the plantar fascia anterior to the calcaneus. Focal area of soft tissue prominence overlies the dorsum of foot in the region of the metatarsal phalangeal joints, tentatively in the region of the 1st-2nd ray. No radiopaque foreign body. IMPRESSION: 1. No acute fracture or dislocation of the left foot. 2. Focal area of soft tissue prominence in the dorsum of the foot in the region of the metatarsophalangeal joints, tentatively in the region of the 1st-2nd ray. This may represent a hematoma in the setting of injury, recommend correlation with physical exam. 3. Multifocal degenerative change. 4. Soft tissue calcifications in the plantar foot in the region of the plantar fascial, chronic. Electronically Signed   By: Quintell Rake M.D.   On: 02/17/2023 17:27   DG Knee Complete 4 Views Left  Result Date: 02/17/2023 CLINICAL DATA:  Pain. Something heavy fell on left knee and foot, painful weight-bearing. EXAM: LEFT KNEE - COMPLETE 4+ VIEW COMPARISON:  None Available. FINDINGS: No acute fracture. No dislocation. There is mild medial tibiofemoral joint space narrowing. Mild tricompartmental peripheral spurring most pronounced in the patellofemoral compartment. Small quadriceps and patellar tendon enthesophytes. Minimal knee joint effusion. No focal soft tissue abnormalities are seen. IMPRESSION: 1. No acute fracture or dislocation. 2. Mild tricompartmental osteoarthritis, most pronounced in the patellofemoral compartment. Minimal knee joint effusion. Electronically Signed   By: Rahkeem Rake M.D.   On: 02/17/2023 17:24    Final Assessment and Plan:   Is a 67 year old male who presents to the ED complaining of left knee and left foot pain after dropping a lawnmower motor on his leg today.  Patient reports that he has been unable to ambulate since the injury secondary to increased pain with weightbearing.  Patient  denies previous injury to either of these joints.  On exam, he has point tenderness over the left patella and tenderness over the dorsum of the left foot diffusely but no obvious deformity, compartments are soft, and he is neurovascularly intact distally with no wounds or significant skin changes.  X-rays obtained as above for further evaluation.  X-rays with some degenerative changes but no significant acute findings. Updated pt and wife on all findings and discharge plan. Pt aware of need to follow up with PCP and orthopedics for any persistent, continued symptoms. Strict ED return precautions given, all questions  answered, and stable for discharge.    Clinical Impression:  1. Knee strain, left, initial encounter   2. Strain of left ankle, initial encounter   3. Primary osteoarthritis of left knee      Discharge           Final Clinical Impression(s) / ED Diagnoses Final diagnoses:  Knee strain, left, initial encounter  Strain of left ankle, initial encounter  Primary osteoarthritis of left knee    Rx / DC Orders ED Discharge Orders          Ordered    ibuprofen (ADVIL) 600 MG tablet  Every 8 hours PRN        02/17/23 1813              Suzzette Righter, PA-C 02/18/23 0145    Malvin Johns, MD 02/18/23 [phone]

## 2023-02-17 NOTE — ED Triage Notes (Signed)
Something heavy fell on his leftknee and foot  , hurts to bear weight  painful to touch top of foot

## 2023-02-17 NOTE — Discharge Instructions (Addendum)
Thank you for letting us take care of you today.   The x-rays of your knee and your foot did not show any broken or out of place bones.  You do have some arthritis particularly in your knee.  As discussed, we are providing you with bracing for the knee and the ankle as well as crutches to help you get around over the next few days.  Please utilize these as discussed and when able avoid using these as they can tighten the muscles and thus lead to worsening of her pain.  I have also provided some exercises that may help rehabilitate your joints.  Please start doing these as tolerated.  Please take ibuprofen as prescribed over the next 2 to 3 days to help with pain and inflammation.  If you are not noticing significant improvement in your symptoms by next week, I recommend that you follow-up with orthopedics.  I provided the information for our on-call orthopedic or you may follow-up with an orthopedic doctor of your own choosing.  For any new injury or worsening symptoms, please return to the nearest emergency department for reevaluation.

## 2023-03-01 DIAGNOSIS — M25572 Pain in left ankle and joints of left foot: Secondary | ICD-10-CM | POA: Diagnosis not present

## 2023-03-02 ENCOUNTER — Telehealth: Payer: Self-pay | Admitting: *Deleted

## 2023-03-02 NOTE — Telephone Encounter (Signed)
        Patient  visited [facility] on 02/17/2023  for treatment    Telephone encounter attempt :  1st  A HIPAA compliant voice message was left requesting a return call.  Instructed patient to call back at [phone].  [place] [phone] [address] , [place] [address] Email : [email or web address]

## 2023-03-15 DIAGNOSIS — M25572 Pain in left ankle and joints of left foot: Secondary | ICD-10-CM | POA: Diagnosis not present

## 2023-05-16 DIAGNOSIS — R972 Elevated prostate specific antigen [PSA]: Secondary | ICD-10-CM | POA: Diagnosis not present

## 2023-05-20 DIAGNOSIS — R972 Elevated prostate specific antigen [PSA]: Secondary | ICD-10-CM | POA: Diagnosis not present

## 2023-05-20 DIAGNOSIS — M1990 Unspecified osteoarthritis, unspecified site: Secondary | ICD-10-CM | POA: Diagnosis not present

## 2023-05-20 DIAGNOSIS — E785 Hyperlipidemia, unspecified: Secondary | ICD-10-CM | POA: Diagnosis not present

## 2023-05-20 DIAGNOSIS — R7301 Impaired fasting glucose: Secondary | ICD-10-CM | POA: Diagnosis not present

## 2023-05-20 DIAGNOSIS — I129 Hypertensive chronic kidney disease with stage 1 through stage 4 chronic kidney disease, or unspecified chronic kidney disease: Secondary | ICD-10-CM | POA: Diagnosis not present

## 2023-05-20 DIAGNOSIS — R945 Abnormal results of liver function studies: Secondary | ICD-10-CM | POA: Diagnosis not present

## 2023-05-20 DIAGNOSIS — N1831 Chronic kidney disease, stage 3a: Secondary | ICD-10-CM | POA: Diagnosis not present

## 2023-05-23 DIAGNOSIS — N400 Enlarged prostate without lower urinary tract symptoms: Secondary | ICD-10-CM | POA: Diagnosis not present

## 2023-05-23 DIAGNOSIS — R972 Elevated prostate specific antigen [PSA]: Secondary | ICD-10-CM | POA: Diagnosis not present

## 2023-11-22 DIAGNOSIS — R972 Elevated prostate specific antigen [PSA]: Secondary | ICD-10-CM | POA: Diagnosis not present

## 2023-12-16 DIAGNOSIS — N1831 Chronic kidney disease, stage 3a: Secondary | ICD-10-CM | POA: Diagnosis not present

## 2023-12-16 DIAGNOSIS — R7301 Impaired fasting glucose: Secondary | ICD-10-CM | POA: Diagnosis not present

## 2023-12-16 DIAGNOSIS — R972 Elevated prostate specific antigen [PSA]: Secondary | ICD-10-CM | POA: Diagnosis not present

## 2023-12-16 DIAGNOSIS — E785 Hyperlipidemia, unspecified: Secondary | ICD-10-CM | POA: Diagnosis not present

## 2023-12-16 DIAGNOSIS — Z1212 Encounter for screening for malignant neoplasm of rectum: Secondary | ICD-10-CM | POA: Diagnosis not present

## 2023-12-23 DIAGNOSIS — M1990 Unspecified osteoarthritis, unspecified site: Secondary | ICD-10-CM | POA: Diagnosis not present

## 2023-12-23 DIAGNOSIS — R82998 Other abnormal findings in urine: Secondary | ICD-10-CM | POA: Diagnosis not present

## 2023-12-23 DIAGNOSIS — N1831 Chronic kidney disease, stage 3a: Secondary | ICD-10-CM | POA: Diagnosis not present

## 2023-12-23 DIAGNOSIS — Z0000 Encounter for general adult medical examination without abnormal findings: Secondary | ICD-10-CM | POA: Diagnosis not present

## 2023-12-23 DIAGNOSIS — E785 Hyperlipidemia, unspecified: Secondary | ICD-10-CM | POA: Diagnosis not present

## 2023-12-23 DIAGNOSIS — I129 Hypertensive chronic kidney disease with stage 1 through stage 4 chronic kidney disease, or unspecified chronic kidney disease: Secondary | ICD-10-CM | POA: Diagnosis not present

## 2023-12-23 DIAGNOSIS — Z1331 Encounter for screening for depression: Secondary | ICD-10-CM | POA: Diagnosis not present

## 2023-12-23 DIAGNOSIS — R945 Abnormal results of liver function studies: Secondary | ICD-10-CM | POA: Diagnosis not present

## 2023-12-23 DIAGNOSIS — Z1339 Encounter for screening examination for other mental health and behavioral disorders: Secondary | ICD-10-CM | POA: Diagnosis not present

## 2023-12-23 DIAGNOSIS — E1122 Type 2 diabetes mellitus with diabetic chronic kidney disease: Secondary | ICD-10-CM | POA: Diagnosis not present

## 2023-12-23 DIAGNOSIS — R972 Elevated prostate specific antigen [PSA]: Secondary | ICD-10-CM | POA: Diagnosis not present

## 2024-01-16 DIAGNOSIS — R972 Elevated prostate specific antigen [PSA]: Secondary | ICD-10-CM | POA: Diagnosis not present

## 2024-01-16 DIAGNOSIS — C61 Malignant neoplasm of prostate: Secondary | ICD-10-CM | POA: Diagnosis not present

## 2024-01-16 DIAGNOSIS — D075 Carcinoma in situ of prostate: Secondary | ICD-10-CM | POA: Diagnosis not present

## 2024-01-16 DIAGNOSIS — N4289 Other specified disorders of prostate: Secondary | ICD-10-CM | POA: Diagnosis not present

## 2024-02-07 DIAGNOSIS — C61 Malignant neoplasm of prostate: Secondary | ICD-10-CM | POA: Diagnosis not present

## 2024-02-09 ENCOUNTER — Other Ambulatory Visit (HOSPITAL_COMMUNITY): Payer: Self-pay | Admitting: Urology

## 2024-02-09 DIAGNOSIS — C61 Malignant neoplasm of prostate: Secondary | ICD-10-CM

## 2024-02-15 ENCOUNTER — Encounter (HOSPITAL_COMMUNITY)
Admission: RE | Admit: 2024-02-15 | Discharge: 2024-02-15 | Disposition: A | Discharge status: Home / Self Care | Payer: Managed Care, Other (non HMO) | Source: Ambulatory Visit | Attending: Urology | Admitting: Urology

## 2024-02-15 DIAGNOSIS — C61 Malignant neoplasm of prostate: Secondary | ICD-10-CM | POA: Diagnosis not present

## 2024-02-15 DIAGNOSIS — C7951 Secondary malignant neoplasm of bone: Secondary | ICD-10-CM | POA: Diagnosis not present

## 2024-02-15 DIAGNOSIS — M2578 Osteophyte, vertebrae: Secondary | ICD-10-CM | POA: Diagnosis not present

## 2024-02-15 MED ORDER — FLOTUFOLASTAT F 18 GALLIUM 296-5846 MBQ/ML IV SOLN
8.0000 | Freq: Once | INTRAVENOUS | Status: AC
  Administered 2024-02-15: 8.76 via INTRAVENOUS
  Filled 2024-02-15: qty 8

## 2024-02-22 ENCOUNTER — Encounter: Payer: Self-pay | Admitting: Radiation Oncology

## 2024-02-22 NOTE — Progress Notes (Signed)
 GU Location of Tumor / Histology: Prostate Ca  If Prostate Cancer, Gleason Score is (4 + 4) and PSA is (9.19 on 11/15/2023)  Inis Sizer presented as referral from Dr. Sebastian Ache [facility]) elevated PSA.  Biopsies    02/15/2024 Dr. Sebastian Ache NM PET (PSMA) Skull to Mid Thigh CLINICAL DATA:  Prostate carcinoma. High-grade staging.   IMPRESSION: 1. Intense radiotracer activity within the prostate gland consistent primary prostate adenocarcinoma. 2. No evidence of metastatic adenopathy or visceral metastasis. 3. Single focus of intense radiotracer activity localizing medial aspect of the RIGHT fourth rib. Solitary skeletal metastasis is not typically favored with no evidence advanced disease elsewhere on the scan.  Past/Anticipated interventions by urology, if any: 03/12/2024 with Dr. Berneice Heinrich  Past/Anticipated interventions by medical oncology, if any: NA  Weight changes, if any: No  IPSS:  6 SHIM:  5  Bowel/Bladder complaints, if any:  No  Nausea/Vomiting, if any: No  Pain issues, if any:  4/10 mid-lower back  SAFETY ISSUES: Prior radiation? No Pacemaker/ICD? No Possible current pregnancy? Male Is the patient on methotrexate? No  Current Complaints / other details:

## 2024-02-26 NOTE — Progress Notes (Signed)
 Radiation Oncology         [phone] ________________________________  Initial Outpatient Consultation  Name: Joseph Cox MRN: [id number]  Date: 02/27/2024  DOB: [birth year]  ZD:GUYQ, Ravisankar, MD  Loletta Parish., *   REFERRING PHYSICIAN: Loletta Parish., *  DIAGNOSIS: 69 y.o. 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  No diagnosis found.  HISTORY OF PRESENT ILLNESS: Joseph Cox is a 68 y.o. male with a diagnosis of prostate cancer. He was initially referred to Dr. Benancio Deeds back in 2021 for a marginally elevated PSA of 4.33. His PSA fluctuated but remained under 6 during 2022. It rose to 7 in 2023, where it remained stable for the year. It rose again to 8.74 in 05/2023, digital rectal examination performed at that time showed no nodules within limitation of high-riding prostate and tight anal opening. A repeat PSA on 11/16/23 showed a further rise to 9.19. The patient agree to proceed with transrectal ultrasound with 12 biopsies of the prostate on 01/16/24.  The prostate volume measured 57 cc.  Out of 12 core biopsies, 4 were positive.  The maximum Gleason score was 4+4, and this was seen in left apex. Additionally, Gleason 3+3 was seen in right apex (two foci), right mid lateral, and right apex lateral.  He underwent staging PSMA PET scan on 02/15/24 showing: intense radiotracer activity within prostate; no evidence of metastatic adenopathy or visceral metastasis; single focus of radiotracer activity localizing medial aspect of right 4th rib, solitary skeletal metastasis no typically favored with no evidence advanced disease elsewhere.  The patient reviewed the biopsy results with his urologist and he has kindly been referred today for discussion of potential radiation treatment options.   PREVIOUS RADIATION THERAPY: No  PAST MEDICAL HISTORY:  Past Medical History:  Diagnosis Date   Elevated PSA    Hyperlipidemia     Hypertension    Obesity       PAST SURGICAL HISTORY: Past Surgical History:  Procedure Laterality Date   PROSTATE BIOPSY     shoulder surgery Left     FAMILY HISTORY:  Family History  Problem Relation Age of Onset   Diabetes Mother    Hypertension Father    Diabetes Father    Hypertension Sister    Diabetes Sister     SOCIAL HISTORY:  Social History   Socioeconomic History   Marital status: Single    Spouse name: Not on file   Number of children: Not on file   Years of education: Not on file   Highest education level: Not on file  Occupational History   Not on file  Tobacco Use   Smoking status: Never   Smokeless tobacco: Never  Vaping Use   Vaping status: Never Used  Substance and Sexual Activity   Alcohol use: No    Alcohol/week: 0.0 standard drinks of alcohol   Drug use: Never   Sexual activity: Not on file  Other Topics Concern   Not on file  Social History Narrative   Not on file   Social Drivers of Health   Financial Resource Strain: Not on file  Food Insecurity: Not on file  Transportation Needs: Not on file  Physical Activity: Not on file  Stress: Not on file  Social Connections: Not on file  Intimate Partner Violence: Not on file    ALLERGIES: Percocet [oxycodone-acetaminophen]  MEDICATIONS:  Current Outpatient Medications  Medication Sig Dispense Refill  atorvastatin (LIPITOR) 20 MG tablet Take 20 mg by mouth daily.     cyclobenzaprine (FLEXERIL) 5 MG tablet Take 1 tablet (5 mg total) by mouth 3 (three) times daily as needed for muscle spasms. (Patient not taking: Reported on 05/02/2015) 30 tablet 0   ibuprofen (ADVIL) 600 MG tablet 1 tablet with food or milk as needed Orally Three times a day for 30 days     traMADol (ULTRAM) 50 MG tablet Take 1 tablet (50 mg total) by mouth every 6 (six) hours as needed for pain. (Patient not taking: Reported on 05/02/2015) 15 tablet 0   VALSARTAN PO Take 25 mg by mouth.     No current  facility-administered medications for this encounter.    REVIEW OF SYSTEMS:  On review of systems, the patient reports that he is doing well overall. He denies any chest pain, shortness of breath, cough, fevers, chills, night sweats, unintended weight changes. He denies any bowel disturbances, and denies abdominal pain, nausea or vomiting. He denies any new musculoskeletal or joint aches or pains. His IPSS was ***, indicating *** urinary symptoms. His SHIM was ***, indicating he {does not have/has mild/moderate/severe} erectile dysfunction. A complete review of systems is obtained and is otherwise negative.    PHYSICAL EXAM:  Wt Readings from Last 3 Encounters:  02/17/23 252 lb (114.3 kg)  07/29/15 233 lb (105.7 kg)  05/02/15 241 lb 3.2 oz (109.4 kg)   Temp Readings from Last 3 Encounters:  02/17/23 98.6 F (37 C)  07/29/15 98.3 F (36.8 C) (Oral)  05/02/15 97.9 F (36.6 C) (Oral)   BP Readings from Last 3 Encounters:  02/17/23 130/82  07/29/15 110/76  05/02/15 (!) 150/90   Pulse Readings from Last 3 Encounters:  02/17/23 97  07/29/15 80  05/02/15 100    /10  In general this is a well appearing *** male in no acute distress. He's alert and oriented x4 and appropriate throughout the examination. Cardiopulmonary assessment is negative for acute distress, and he exhibits normal effort.     KPS = ***  100 - Normal; no complaints; no evidence of disease. 90   - Able to carry on normal activity; minor signs or symptoms of disease. 80   - Normal activity with effort; some signs or symptoms of disease. 52   - Cares for self; unable to carry on normal activity or to do active work. 60   - Requires occasional assistance, but is able to care for most of his personal needs. 50   - Requires considerable assistance and frequent medical care. 40   - Disabled; requires special care and assistance. 30   - Severely disabled; hospital admission is indicated although death not imminent. 20    - Very sick; hospital admission necessary; active supportive treatment necessary. 10   - Moribund; fatal processes progressing rapidly. 0     - Dead  Karnofsky DA, Abelmann WH, Craver LS and Burchenal JH [phone]) The use of the nitrogen mustards in the palliative treatment of carcinoma: with particular reference to bronchogenic carcinoma Cancer 1 [phone]  LABORATORY DATA:  No results found for: "WBC", "HGB", "HCT", "MCV", "PLT" No results found for: "NA", "K", "CL", "CO2" No results found for: "ALT", "AST", "GGT", "ALKPHOS", "BILITOT"   RADIOGRAPHY: NM PET (PSMA) SKULL TO MID THIGH Result Date: 02/25/2024 CLINICAL DATA:  Prostate carcinoma.  High-grade staging. EXAM: NUCLEAR MEDICINE PET SKULL BASE TO THIGH TECHNIQUE: 8.8 mCi Flotufolastat (Posluma) was injected intravenously. Full-ring PET imaging was performed  from the skull base to thigh after the radiotracer. CT data was obtained and used for attenuation correction and anatomic localization. COMPARISON:  None Available. FINDINGS: NECK No radiotracer activity in neck lymph nodes. Incidental CT finding: None. CHEST No radiotracer accumulation within mediastinal or hilar lymph nodes. Small pulmonary nodule in the RIGHT upper lobe measures 5 mm on image 76/4) Incidental CT finding: None. ABDOMEN/PELVIS Prostate: Intense radiotracer activity within the LEFT lobe of the prostate gland with SUV max equal 40 (image 188). Second focus of intense activity is central within the gland gland just RIGHT of midline with SUV max 12 same image. Lymph nodes: No abnormal radiotracer accumulation within pelvic or abdominal nodes. Liver: No liver metastasis. Incidental CT finding: None. SKELETON Single focus of increased radiotracer activity localizes to the most medial aspect of the RIGHT fourth rib with SUV max equal 6.9 on image 62. There is no clear CT lesion associated with this activity. There is degenerative osteophytosis at the junction of the ribs and thoracic  spine; however, there is similar osteophytosis at multiple levels in thoracic spine along the RIGHT border. IMPRESSION: 1. Intense radiotracer activity within the prostate gland consistent primary prostate adenocarcinoma. 2. No evidence of metastatic adenopathy or visceral metastasis. 3. Single focus of intense radiotracer activity localizing medial aspect of the RIGHT fourth rib. Solitary skeletal metastasis is not typically favored with no evidence advanced disease elsewhere on the scan. Electronically Signed   By: Genevive Bi M.D.   On: 02/25/2024 12:53      IMPRESSION/PLAN: 1. 68 y.o. gentleman with Stage T1c adenocarcinoma of the prostate with Gleason Score of 4+4, and PSA of 9.2. We discussed the patient's workup and outlined the nature of prostate cancer in this setting. The patient's T stage, Gleason's score, and PSA put him into the high risk group. Accordingly, he is eligible for a variety of potential treatment options including LT-ADT concurrent with 8 weeks of external radiation, 5 weeks of external radiation with an upfront brachytherapy boost, or prostatectomy. We discussed the available radiation techniques, and focused on the details and logistics of delivery. We discussed and outlined the risks, benefits, short and long-term effects associated with radiotherapy and compared and contrasted these with prostatectomy. We discussed the role of SpaceOAR gel in reducing the rectal toxicity associated with radiotherapy. We also detailed the role of ADT in the treatment of high risk prostate cancer and outlined the associated side effects that could be expected with this therapy. He appears to have a good understanding of his disease and our treatment recommendations which are of curative intent.  He was encouraged to ask questions that were answered to his stated satisfaction.  At the conclusion of our conversation, the patient is interested in moving forward with ***.  We personally spent  *** minutes in this encounter including chart review, reviewing radiological studies, meeting face-to-face with the patient, entering orders and completing documentation.    Marguarite Arbour, PA-C    Margaretmary Dys, MD  [facility]  Radiation Oncology Direct Dial: [phone]  Fax: [phone] [email or web address]  Skype  LinkedIn   This document serves as a record of services personally performed by Margaretmary Dys, MD and Marcello Fennel, PA-C. It was created on their behalf by Mickie Bail, a trained medical scribe. The creation of this record is based on the scribe's personal observations and the provider's statements to them. This document has been checked and approved by the attending provider.

## 2024-02-27 ENCOUNTER — Telehealth: Payer: Self-pay | Admitting: *Deleted

## 2024-02-27 ENCOUNTER — Ambulatory Visit
Admission: RE | Admit: 2024-02-27 | Discharge: 2024-02-27 | Disposition: A | Discharge status: Home / Self Care | Payer: Managed Care, Other (non HMO) | Source: Ambulatory Visit | Attending: Radiation Oncology | Admitting: Radiation Oncology

## 2024-02-27 ENCOUNTER — Encounter: Payer: Self-pay | Admitting: Radiation Oncology

## 2024-02-27 VITALS — BP 145/87 | HR 86 | Temp 97.0°F | Resp 20 | Ht 70.0 in | Wt 243.6 lb

## 2024-02-27 DIAGNOSIS — C61 Malignant neoplasm of prostate: Secondary | ICD-10-CM | POA: Insufficient documentation

## 2024-02-27 DIAGNOSIS — Z191 Hormone sensitive malignancy status: Secondary | ICD-10-CM | POA: Diagnosis not present

## 2024-02-27 DIAGNOSIS — M899 Disorder of bone, unspecified: Secondary | ICD-10-CM | POA: Diagnosis not present

## 2024-02-27 DIAGNOSIS — C7951 Secondary malignant neoplasm of bone: Secondary | ICD-10-CM | POA: Diagnosis not present

## 2024-02-27 DIAGNOSIS — E785 Hyperlipidemia, unspecified: Secondary | ICD-10-CM | POA: Diagnosis not present

## 2024-02-27 DIAGNOSIS — E1122 Type 2 diabetes mellitus with diabetic chronic kidney disease: Secondary | ICD-10-CM | POA: Insufficient documentation

## 2024-02-27 DIAGNOSIS — E669 Obesity, unspecified: Secondary | ICD-10-CM | POA: Insufficient documentation

## 2024-02-27 DIAGNOSIS — Z79899 Other long term (current) drug therapy: Secondary | ICD-10-CM | POA: Insufficient documentation

## 2024-02-27 DIAGNOSIS — I10 Essential (primary) hypertension: Secondary | ICD-10-CM | POA: Insufficient documentation

## 2024-02-27 HISTORY — DX: Hyperlipidemia, unspecified: E78.5

## 2024-02-27 HISTORY — DX: Elevated prostate specific antigen (PSA): R97.20

## 2024-02-27 HISTORY — DX: Obesity, unspecified: E66.9

## 2024-02-27 NOTE — Telephone Encounter (Signed)
 CALLED PATIENT TO INFORM OF ADT APPT. WITH DR. MANNY ON 03-12-24- ARRIVAL TIME- 9:30 AM @ DR. MANNY'S OFFICE, SPOKE WITH PATIENT AND HE IS AWARE OF THIS APPT.

## 2024-02-27 NOTE — Telephone Encounter (Signed)
 Called patient to inform of MRI for 03-05-24- arrival time- 12 pm @ [facility], no restrictions to scan, spoke with patient and he is aware of this scan

## 2024-02-27 NOTE — Progress Notes (Signed)
 Introduced myself to the patient, and his wife, as the prostate nurse navigator.  He is here to discuss his radiation treatment options, and will proceed with ADT, Brachy boost, and 5 weeks of daily radiation.  MD recommendations to obtain MRI of spine for further evaluation.  I gave him my business card and asked him to call me with questions or concerns.  Verbalized understanding.

## 2024-03-01 NOTE — Progress Notes (Signed)
 Patient is scheduled for MRI on 3/24 and ADT with Dr. Berneice Heinrich on 3/31.   RN will follow up after MRI to ensure final report for confirmation of treatment recommendations.

## 2024-03-05 ENCOUNTER — Ambulatory Visit (HOSPITAL_COMMUNITY)
Admission: RE | Admit: 2024-03-05 | Discharge: 2024-03-05 | Disposition: A | Discharge status: Home / Self Care | Source: Ambulatory Visit | Attending: Urology | Admitting: Urology

## 2024-03-05 DIAGNOSIS — M899 Disorder of bone, unspecified: Secondary | ICD-10-CM | POA: Insufficient documentation

## 2024-03-05 DIAGNOSIS — C61 Malignant neoplasm of prostate: Secondary | ICD-10-CM | POA: Insufficient documentation

## 2024-03-05 MED ORDER — GADOBUTROL 1 MMOL/ML IV SOLN
10.0000 mL | Freq: Once | INTRAVENOUS | Status: AC | PRN
  Administered 2024-03-05: 10 mL via INTRAVENOUS

## 2024-03-12 DIAGNOSIS — C61 Malignant neoplasm of prostate: Secondary | ICD-10-CM | POA: Diagnosis not present

## 2024-03-12 NOTE — Progress Notes (Signed)
 Patient started Casodex today, 3/31, with Dr. Berneice Heinrich and is scheduled to transition to [facility] on 4/14.     Plan of care in progress.

## 2024-03-14 ENCOUNTER — Ambulatory Visit
Admission: RE | Admit: 2024-03-14 | Discharge: 2024-03-14 | Disposition: A | Discharge status: Home / Self Care | Source: Ambulatory Visit | Attending: Urology | Admitting: Urology

## 2024-03-14 ENCOUNTER — Encounter: Payer: Self-pay | Admitting: Urology

## 2024-03-14 VITALS — Ht 70.0 in | Wt 245.0 lb

## 2024-03-14 DIAGNOSIS — Z191 Hormone sensitive malignancy status: Secondary | ICD-10-CM | POA: Diagnosis not present

## 2024-03-14 DIAGNOSIS — C61 Malignant neoplasm of prostate: Secondary | ICD-10-CM | POA: Diagnosis not present

## 2024-03-14 DIAGNOSIS — M899 Disorder of bone, unspecified: Secondary | ICD-10-CM | POA: Diagnosis not present

## 2024-03-14 NOTE — Progress Notes (Signed)
 Radiation Oncology         [phone] ________________________________  Name: Joseph Cox MRN: [id number]  Date: 03/14/2024  DOB: Nov 06, 1956  Follow up Note  CC: Avva, Joylene Draft, MD  Loletta Parish., *  Diagnosis:   68 y.o. 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   Narrative:  The patient returns today for follow-up to review the results of the thoracic MRI from 03/05/24 that was performed to further evaluate the single focus of radiotracer activity localizing to the medial aspect of the right 4th rib at the CV junction that was seen on PSMA PET but without a CT correlate. The MRI shows hypertrophic degenerative change at the right costovertebral junctions at T4 and T5. The edema and enhancement is more notable at T4 and quite likely explains the increased PET activity. The pattern would be unlikely to represent metastatic disease. Of course, it would not be possible to absolutely exclude a solitary metastasis manifesting in this location but that seems quite unlikely. There was no evidence of metastatic disease elsewhere in the thoracic spine.  We reviewed these results by telephone today.                           On review of systems, the patient states that he is doing well overall. He denies any chest pain, shortness of breath, cough, fevers, chills, night sweats, unintended weight changes. He denies any bowel disturbances, and denies abdominal pain, nausea or vomiting. He denies any new musculoskeletal or joint aches or pains. His IPSS was 6, indicating mild urinary symptoms. His SHIM was 5, indicating he has severe erectile dysfunction. A complete review of systems is obtained and is otherwise negative.   ALLERGIES:  is allergic to percocet [oxycodone-acetaminophen].  Meds: Current Outpatient Medications  Medication Sig Dispense Refill   atorvastatin (LIPITOR) 20 MG tablet Take 20 mg by mouth daily.      valsartan-hydrochlorothiazide (DIOVAN-HCT) 320-25 MG tablet Take 1 tablet by mouth daily.     No current facility-administered medications for this encounter.    Physical Findings:  height is 5\' 10"  (1.778 m) and weight is 245 lb (111.1 kg).  Pain Assessment Pain Score: 0-No pain/10 Unable to assess due to telephone follow up visit format.  Lab Findings: No results found for: "WBC", "HGB", "HCT", "MCV", "PLT"   Radiographic Findings: MR THORACIC SPINE W WO CONTRAST Result Date: 03/07/2024 CLINICAL DATA:  Metastatic disease evaluation. High risk prostate cancer with suspicious lesion at the right fourth CV junction EXAM: MRI THORACIC WITHOUT AND WITH CONTRAST TECHNIQUE: Multiplanar and multiecho pulse sequences of the thoracic spine were obtained without and with intravenous contrast. CONTRAST:  10mL GADAVIST GADOBUTROL 1 MMOL/ML IV SOLN COMPARISON:  PET scan 02/15/2024 FINDINGS: Alignment:  Normal Vertebrae: No vertebral body fracture or marrow space lesion. There is hypertrophic degenerative change at the right costovertebral junctions at T4 and T5. Edema and enhancement is more notable at T4. This quite likely explains the increased PET activity. the pattern would be unlikely to represent metastatic disease. Cord:  No cord compression or focal cord lesion. Paraspinal and other soft tissues: Otherwise negative Disc levels: Mild noncompressive disc bulges in the upper and midthoracic region but no significant narrowing of the canal or foramina. No edematous facet arthritis. IMPRESSION: 1. Hypertrophic degenerative change at the right costovertebral junctions at T4 and T5. Edema and enhancement is more notable  at T4. This quite likely explains the increased PET activity. The pattern would be unlikely to represent metastatic disease. Of course, it would not be possible to absolutely exclude a solitary metastasis manifest in this location but that seems quite unlikely. If there is ongoing clinical  concern, this area could be re-evaluated in about 3 months, but I think the likelihood is low. 2. No evidence of metastatic disease elsewhere in the thoracic spine. 3. Mild noncompressive disc bulges in the upper and midthoracic region but no significant narrowing of the canal or foramina. Electronically Signed   By: Paulina Fusi M.D.   On: 03/07/2024 07:38   NM PET (PSMA) SKULL TO MID THIGH Result Date: 02/25/2024 CLINICAL DATA:  Prostate carcinoma.  High-grade staging. EXAM: NUCLEAR MEDICINE PET SKULL BASE TO THIGH TECHNIQUE: 8.8 mCi Flotufolastat (Posluma) was injected intravenously. Full-ring PET imaging was performed from the skull base to thigh after the radiotracer. CT data was obtained and used for attenuation correction and anatomic localization. COMPARISON:  None Available. FINDINGS: NECK No radiotracer activity in neck lymph nodes. Incidental CT finding: None. CHEST No radiotracer accumulation within mediastinal or hilar lymph nodes. Small pulmonary nodule in the RIGHT upper lobe measures 5 mm on image 76/4) Incidental CT finding: None. ABDOMEN/PELVIS Prostate: Intense radiotracer activity within the LEFT lobe of the prostate gland with SUV max equal 40 (image 188). Second focus of intense activity is central within the gland gland just RIGHT of midline with SUV max 12 same image. Lymph nodes: No abnormal radiotracer accumulation within pelvic or abdominal nodes. Liver: No liver metastasis. Incidental CT finding: None. SKELETON Single focus of increased radiotracer activity localizes to the most medial aspect of the RIGHT fourth rib with SUV max equal 6.9 on image 62. There is no clear CT lesion associated with this activity. There is degenerative osteophytosis at the junction of the ribs and thoracic spine; however, there is similar osteophytosis at multiple levels in thoracic spine along the RIGHT border. IMPRESSION: 1. Intense radiotracer activity within the prostate gland consistent primary  prostate adenocarcinoma. 2. No evidence of metastatic adenopathy or visceral metastasis. 3. Single focus of intense radiotracer activity localizing medial aspect of the RIGHT fourth rib. Solitary skeletal metastasis is not typically favored with no evidence advanced disease elsewhere on the scan. Electronically Signed   By: Genevive Bi M.D.   On: 02/25/2024 12:53    Impression/Plan: 1. 68 y.o. gentleman with Stage T1c adenocarcinoma of the prostate with Gleason Score of 4+4, and PSA of 9.2.  The suspicious lesion at the right 4th CV junction is not worrisome for metastasis on MRI thoracic spine so this confirms that he is eligible for a variety of potential treatment options including prostatectomy or LT-ADT concurrent with either 8 weeks of external radiation or 5 weeks of external radiation with an upfront brachytherapy boost. He has already started ADT with Casodex on 03/12/24, with plans to transition to Eligard on 03/26/24 and remains most interested in moving forward with brachytherapy boost and use of SpaceOAR gel, followed by a 5 week course of daily radiotherapy concurrent with LT-ADT. We will share our discussion with Dr. Berneice Heinrich and coordinate for the seed boost procedure for June 2025, approximately 2 months from start of ADT.  We will see him back 2-3 weeks after his seed boost procedure and will proceed with CT [facility] prostate for treatment planning in anticipation of beginning the 5 week course of daily external beam radiation, 3-4 weeks after the seed boost procedure.  He appears to have a good understanding of his disease and our treatment recommendations which are of curative intent and he is in agreement with the stated plan. I enjoyed meeting with him and his wife, Aurea Graff, again today and look forward to continuing to participate in his care.   I personally spent 20 minutes in this encounter including chart review, reviewing radiological studies, telephone conversation with the patient,  entering orders, coordinating care and completing documentation.    Marguarite Arbour, PA-C

## 2024-03-14 NOTE — Progress Notes (Signed)
 Telephone nursing appointment for review of most recent MRI results. I verified patient's identity x2 and began nursing interview.   Patient reports doing well. Patient denies any related issues at this time.   Meaningful use complete.   Patient aware of their telephone appointment w/ Ashlyn Bruning PA-C. I left my extension [phone] in case patient needs anything. Patient verbalized understanding. This concludes the nursing interview.   Patient preferred phone # [phone]     Ruel Favors, LPN

## 2024-03-21 ENCOUNTER — Other Ambulatory Visit: Payer: Self-pay | Admitting: Urology

## 2024-03-22 ENCOUNTER — Telehealth: Payer: Self-pay | Admitting: *Deleted

## 2024-03-22 NOTE — Telephone Encounter (Signed)
 CALLED PATIENT TO INFORM OF PRE-SEED APPTS. AND IMPLANT DATE, SPOKE WITH PATIENT AND HE IS AWARE OF THESE DATES

## 2024-03-26 DIAGNOSIS — C61 Malignant neoplasm of prostate: Secondary | ICD-10-CM | POA: Diagnosis not present

## 2024-04-06 NOTE — Progress Notes (Signed)
 RN spoke with patient and patient's wife to review next steps with CT Simulation and brachy boost.  All questions answered.  Will continue to follow, plan of care in progress.

## 2024-04-25 DIAGNOSIS — R945 Abnormal results of liver function studies: Secondary | ICD-10-CM | POA: Diagnosis not present

## 2024-04-25 DIAGNOSIS — E1122 Type 2 diabetes mellitus with diabetic chronic kidney disease: Secondary | ICD-10-CM | POA: Diagnosis not present

## 2024-04-25 DIAGNOSIS — I129 Hypertensive chronic kidney disease with stage 1 through stage 4 chronic kidney disease, or unspecified chronic kidney disease: Secondary | ICD-10-CM | POA: Diagnosis not present

## 2024-04-25 DIAGNOSIS — E785 Hyperlipidemia, unspecified: Secondary | ICD-10-CM | POA: Diagnosis not present

## 2024-04-25 DIAGNOSIS — N1831 Chronic kidney disease, stage 3a: Secondary | ICD-10-CM | POA: Diagnosis not present

## 2024-04-25 DIAGNOSIS — R972 Elevated prostate specific antigen [PSA]: Secondary | ICD-10-CM | POA: Diagnosis not present

## 2024-04-25 DIAGNOSIS — M1990 Unspecified osteoarthritis, unspecified site: Secondary | ICD-10-CM | POA: Diagnosis not present

## 2024-04-25 DIAGNOSIS — C61 Malignant neoplasm of prostate: Secondary | ICD-10-CM | POA: Diagnosis not present

## 2024-05-28 NOTE — Progress Notes (Incomplete)
  Radiation Oncology         [phone] ________________________________  Name: Joseph Cox MRN: [id number]  Date: 06/07/2024  DOB: Apr 14, 1956  SIMULATION AND TREATMENT PLANNING NOTE PUBIC ARCH STUDY  WU:JWJX, Fawn Hooks, MD  Melody Spurling., *  DIAGNOSIS:  Oncology History  Malignant neoplasm of prostate (HCC)  01/16/2024 Cancer Staging   Staging form: Prostate, AJCC 8th Edition - Clinical stage from 01/16/2024: Stage IVB (cT1c, cN0, cM1b, PSA: 9.2, Grade Group: 4) - Signed by Keitha Pata, PA-C on 02/27/2024 Histopathologic type: Adenocarcinoma, NOS Stage prefix: Initial diagnosis Prostate specific antigen (PSA) range: Less than 10 Gleason primary pattern: 4 Gleason secondary pattern: 4 Gleason score: 8 Histologic grading system: 5 grade system Number of biopsy cores examined: 12 Number of biopsy cores positive: 4 Location of positive needle core biopsies: Both sides   02/27/2024 Initial Diagnosis   Malignant neoplasm of prostate (HCC)       ICD-10-CM   1. Malignant neoplasm of prostate (HCC)  C61       COMPLEX SIMULATION:  The patient presented today for evaluation for possible prostate seed implant. He was brought to the radiation planning suite and placed supine on the CT couch. A 3-dimensional image study set was obtained in upload to the planning computer. There, on each axial slice, I contoured the prostate gland. Then, using three-dimensional radiation planning tools I reconstructed the prostate in view of the structures from the transperineal needle pathway to assess for possible pubic arch interference. In doing so, I did not appreciate any pubic arch interference. Also, the patient's prostate volume was estimated based on the drawn structure. The volume was *** cc.  Given the pubic arch appearance and prostate volume, patient remains a good candidate to proceed with prostate seed implant. Today, he freely provided informed written consent to  proceed.    PLAN: The patient will undergo prostate seed implant.   ________________________________  Trilby Fujisawa. Lorri Rota, M.D.

## 2024-06-05 ENCOUNTER — Telehealth: Payer: Self-pay | Admitting: *Deleted

## 2024-06-05 NOTE — Telephone Encounter (Signed)
 CALLED PATIENT TO REMIND OF PRE-SEED APPTS. FOR 06-07-24- @ [facility] - ARRIVAL TIME- 9:45 AM , LVM FOR A RETURN CALL

## 2024-06-05 NOTE — Progress Notes (Signed)
 Pre-seed nursing interview for a diagnosis of  Stage T1c adenocarcinoma of the prostate with Gleason score of 4+4, and PSA of 9.2.  Patient identity verified x2.   Patient states issues as follows...  -Pain: Denies -Fatigue: Moderate -Abdomen: Denies -Groin: Denies -Urinary: Denies -Bowels: Denies -Appetite: Good  Patient denies all other related issues at this time.  Meaningful use complete.  Urinary Management medication(s)- None Urology appointment date- 06/07/2024, with Dr. Alvaro at [facility]  No vitals needed for this visit.  This concludes the interaction.  Rosaline Minerva, LPN  NURSE REMINDER: START 'PRE-SEED' EDUCATION VIDEO AT 4:25 mins.

## 2024-06-07 ENCOUNTER — Encounter: Payer: Self-pay | Admitting: Urology

## 2024-06-07 ENCOUNTER — Ambulatory Visit
Admission: RE | Admit: 2024-06-07 | Discharge: 2024-06-07 | Disposition: A | Discharge status: Home / Self Care | Payer: Self-pay | Source: Ambulatory Visit | Attending: Urology | Admitting: Urology

## 2024-06-07 ENCOUNTER — Ambulatory Visit
Admission: RE | Admit: 2024-06-07 | Discharge: 2024-06-07 | Disposition: A | Discharge status: Home / Self Care | Source: Ambulatory Visit | Attending: Radiation Oncology | Admitting: Radiation Oncology

## 2024-06-07 VITALS — Ht 70.0 in | Wt 241.0 lb

## 2024-06-07 DIAGNOSIS — M899 Disorder of bone, unspecified: Secondary | ICD-10-CM | POA: Diagnosis not present

## 2024-06-07 DIAGNOSIS — C61 Malignant neoplasm of prostate: Secondary | ICD-10-CM

## 2024-06-07 DIAGNOSIS — Z191 Hormone sensitive malignancy status: Secondary | ICD-10-CM | POA: Diagnosis not present

## 2024-06-07 NOTE — Progress Notes (Signed)
 Radiation Oncology         [phone] ________________________________  Outpatient Follow up- Pre-seed visit  Name: Joseph Cox MRN: [id number]  Date: 06/07/2024  DOB: [birth year]  RR:Jccj, Ravisankar, MD  Alvaro Ricardo KATHEE Mickey., *   REFERRING PHYSICIAN: Alvaro Ricardo KATHEE Mickey., *  DIAGNOSIS: 68 y.o. 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    ICD-10-CM   1. Malignant neoplasm of prostate (HCC)  C61       HISTORY OF PRESENT ILLNESS: Joseph Cox is a 68 y.o. male with a diagnosis of prostate cancer. He was initially referred to Dr. Rosalind back in 2021 for a marginally elevated PSA of 4.33. His PSA fluctuated but remained under 6 throughout 2022. It increased to 7.12 in 05/2022, where it remained stable for the year but then increased again to 8.74 in 05/2023, digital rectal examination performed at that time showed no nodules within limitation of high-riding prostate and tight anal opening. A repeat PSA on 11/16/23 showed a further rise to 9.19. The patient met with Dr. Alvaro on 11/21/24 and agreed to proceed with transrectal ultrasound with 12 biopsies of the prostate on 01/16/24.  The prostate volume measured 57 cc.  Out of 12 core biopsies, 4 were positive.  The maximum Gleason score was 4+4, and this was seen in the left apex. Additionally, Gleason 3+3 was seen in the right apex (two foci), right mid lateral, and right apex lateral.   He underwent staging PSMA PET scan on 02/15/24 showing intense radiotracer activity within the prostate without evidence of metastatic adenopathy or visceral metastasis. There was a single focus of radiotracer activity localizing to the medial aspect of the right 4th rib at the CV junction that was without a CT correlate. Per the radiologist's report, solitary skeletal metastasis is not typically favored with no evidence of advanced disease elsewhere. A thoracic MRI was performed on 03/05/24 to further evaluate  the single focus of radiotracer activity localizing to the medial aspect of the right 4th rib at the CV junction that was seen on PSMA PET but without a CT correlate. The MRI showed hypertrophic degenerative change at the right costovertebral junctions at T4 and T5. The edema and enhancement is more notable at T4 and quite likely explains the increased PET activity. The pattern would be unlikely to represent metastatic disease but of course, it would not be possible to absolutely exclude a solitary metastasis manifesting in this location but that seems quite unlikely. There was no evidence of metastatic disease elsewhere in the thoracic spine so he was still felt to be a good candidate for brachytherapy boost and use of SpaceOAR gel, followed by a 5 week course of daily radiotherapy concurrent with LT-ADT.  He started ADT with Casodex on 03/12/24 and transitioned to Eligard  on 03/26/24   The patient reviewed the biopsy results with his urologist and was kindly referred to us  for discussion of potential radiation treatment options. We initially met the patient on 02/27/24 and met back with the patient on 03/14/24 to discuss the results of the MRI scan and he was most interested in proceeding with brachytherapy boost and use of SpaceOAR gel, followed by a 5 week course of daily radiotherapy concurrent with LT-ADT for treatment of his disease. He is here today for his pre-procedure imaging for planning and to answer any additional questions he may have about this treatment.   PREVIOUS RADIATION THERAPY: No  PAST MEDICAL HISTORY:  Past Medical History:  Diagnosis Date   Elevated PSA    Hyperlipidemia    Hypertension    Obesity       PAST SURGICAL HISTORY: Past Surgical History:  Procedure Laterality Date   PROSTATE BIOPSY     shoulder surgery Left     FAMILY HISTORY:  Family History  Problem Relation Age of Onset   Diabetes Mother    Hypertension Father    Diabetes Father    Hypertension Sister     Diabetes Sister     SOCIAL HISTORY:  Social History   Socioeconomic History   Marital status: Single    Spouse name: Not on file   Number of children: Not on file   Years of education: Not on file   Highest education level: Not on file  Occupational History   Not on file  Tobacco Use   Smoking status: Never   Smokeless tobacco: Never  Vaping Use   Vaping status: Never Used  Substance and Sexual Activity   Alcohol  use: No    Alcohol /week: 0.0 standard drinks of alcohol    Drug use: Never   Sexual activity: Not on file  Other Topics Concern   Not on file  Social History Narrative   Not on file   Social Drivers of Health   Financial Resource Strain: Not on file  Food Insecurity: No Food Insecurity (03/14/2024)   Hunger Vital Sign    Worried About Running Out of Food in the Last Year: Never true    Ran Out of Food in the Last Year: Never true  Transportation Needs: No Transportation Needs (03/14/2024)   PRAPARE - Administrator, Civil Service (Medical): No    Lack of Transportation (Non-Medical): No  Physical Activity: Not on file  Stress: Not on file  Social Connections: Not on file  Intimate Partner Violence: Not At Risk (03/14/2024)   Humiliation, Afraid, Rape, and Kick questionnaire    Fear of Current or Ex-Partner: No    Emotionally Abused: No    Physically Abused: No    Sexually Abused: No    ALLERGIES: Percocet [oxycodone-acetaminophen]  MEDICATIONS:  Current Outpatient Medications  Medication Sig Dispense Refill   atorvastatin (LIPITOR) 20 MG tablet Take 20 mg by mouth daily.     valsartan-hydrochlorothiazide (DIOVAN-HCT) 320-25 MG tablet Take 1 tablet by mouth daily.     No current facility-administered medications for this encounter.    REVIEW OF SYSTEMS:  On review of systems, the patient reports that he is doing well overall. He denies any chest pain, shortness of breath, cough, fevers, chills, night sweats, unintended weight changes. He  denies any bowel disturbances, and denies abdominal pain, nausea or vomiting. He denies any new musculoskeletal or joint aches or pains. His IPSS was 6, indicating mild urinary symptoms. His SHIM was 5, indicating he has severe erectile dysfunction. A complete review of systems is obtained and is otherwise negative.    PHYSICAL EXAM:  Wt Readings from Last 3 Encounters:  03/14/24 245 lb (111.1 kg)  02/27/24 243 lb 9.6 oz (110.5 kg)  02/17/23 252 lb (114.3 kg)   Temp Readings from Last 3 Encounters:  02/27/24 (!) 97 F (36.1 C)  02/17/23 98.6 F (37 C)  07/29/15 98.3 F (36.8 C) (Oral)   BP Readings from Last 3 Encounters:  02/27/24 (!) 145/87  02/17/23 130/82  07/29/15 110/76   Pulse Readings from Last 3 Encounters:  02/27/24 86  02/17/23 97  07/29/15 80    /10  In general this is a well appearing African American male in no acute distress. He's alert and oriented x4 and appropriate throughout the examination. Cardiopulmonary assessment is negative for acute distress, and he exhibits normal effort.     KPS = 100  100 - Normal; no complaints; no evidence of disease. 90   - Able to carry on normal activity; minor signs or symptoms of disease. 80   - Normal activity with effort; some signs or symptoms of disease. 56   - Cares for self; unable to carry on normal activity or to do active work. 60   - Requires occasional assistance, but is able to care for most of his personal needs. 50   - Requires considerable assistance and frequent medical care. 40   - Disabled; requires special care and assistance. 30   - Severely disabled; hospital admission is indicated although death not imminent. 20   - Very sick; hospital admission necessary; active supportive treatment necessary. 10   - Moribund; fatal processes progressing rapidly. 0     - Dead  Karnofsky DA, Abelmann WH, Craver LS and Burchenal JH [phone]) The use of the nitrogen mustards in the palliative treatment of carcinoma:  with particular reference to bronchogenic carcinoma Cancer 1 [phone]  LABORATORY DATA:  No results found for: WBC, HGB, HCT, MCV, PLT No results found for: NA, K, CL, CO2 No results found for: ALT, AST, GGT, ALKPHOS, BILITOT   RADIOGRAPHY: No results found.    IMPRESSION/PLAN: 1. 68 y.o. 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 The patient has elected to proceed with brachytherapy boost and use of SpaceOAR gel, followed by a 5 week course of daily radiotherapy concurrent with LT-ADT for treatment of his disease. We reviewed the risks, benefits, short and long-term effects associated with brachytherapy and discussed the role of SpaceOAR in reducing the rectal toxicity associated with radiotherapy.  He appears to have a good understanding of his disease and our treatment recommendations which are of curative intent.  He was encouraged to ask questions that were answered to his stated satisfaction. He has freely signed written consent to proceed today in the office and a copy of this document will be placed in his medical record. His procedure is tentatively scheduled for 07/04/24 in collaboration with Dr. Alvaro and we will see him back for his post-procedure visit approximately 3 weeks thereafter. We look forward to continuing to participate in his care. He knows that he is welcome to call with any questions or concerns at any time in the interim.  I personally spent 30 minutes in this encounter including chart review, reviewing radiological studies, meeting face-to-face with the patient, entering orders and completing documentation.    Sabra MICAEL Rusk, MMS, PA-C   [facility] Radiation Oncology Physician Assistant Direct Dial: [phone]  Fax: [phone]

## 2024-06-07 NOTE — Progress Notes (Signed)
  Radiation Oncology         [phone] ________________________________  Name: Joseph Cox MRN: [id number]  Date: 06/07/2024  DOB: 1956-12-02  SIMULATION AND TREATMENT PLANNING NOTE PUBIC ARCH STUDY  RR:Jccj, Searcy, MD  Alvaro Ricardo KATHEE Mickey., *  DIAGNOSIS:  68 y.o. 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   Oncology History  Malignant neoplasm of prostate (HCC)  01/16/2024 Cancer Staging   Staging form: Prostate, AJCC 8th Edition - Clinical stage from 01/16/2024: Stage IVB (cT1c, cN0, cM1b, PSA: 9.2, Grade Group: 4) - Signed by Sherwood Rise, PA-C on 02/27/2024 Histopathologic type: Adenocarcinoma, NOS Stage prefix: Initial diagnosis Prostate specific antigen (PSA) range: Less than 10 Gleason primary pattern: 4 Gleason secondary pattern: 4 Gleason score: 8 Histologic grading system: 5 grade system Number of biopsy cores examined: 12 Number of biopsy cores positive: 4 Location of positive needle core biopsies: Both sides   02/27/2024 Initial Diagnosis   Malignant neoplasm of prostate (HCC)       ICD-10-CM   1. Malignant neoplasm of prostate (HCC)  C61       COMPLEX SIMULATION:  The patient presented today for evaluation for possible prostate seed implant. He was brought to the radiation planning suite and placed supine on the CT couch. A 3-dimensional image study set was obtained in upload to the planning computer. There, on each axial slice, I contoured the prostate gland. Then, using three-dimensional radiation planning tools I reconstructed the prostate in view of the structures from the transperineal needle pathway to assess for possible pubic arch interference. In doing so, I did not appreciate any pubic arch interference. Also, the patient's prostate volume was estimated based on the drawn structure. The volume was 38 cc.  Given the pubic arch appearance and prostate volume, patient remains a good candidate to  proceed with prostate seed implant. Today, he freely provided informed written consent to proceed.    PLAN: The patient will undergo prostate seed implant boost to be followed by IMRT.   ________________________________  Donnice LABOR. Patrcia, M.D.

## 2024-06-14 DIAGNOSIS — C61 Malignant neoplasm of prostate: Secondary | ICD-10-CM | POA: Diagnosis not present

## 2024-06-18 DIAGNOSIS — C61 Malignant neoplasm of prostate: Secondary | ICD-10-CM | POA: Diagnosis not present

## 2024-06-18 DIAGNOSIS — Z191 Hormone sensitive malignancy status: Secondary | ICD-10-CM | POA: Diagnosis not present

## 2024-06-20 NOTE — Progress Notes (Signed)
 COVID Vaccine received:  []  No [x]  Yes Date of any COVID positive Test in last 90 days:  PCP - Searcy Overcast, MD [phone]  Cardiologist -  Oncology- Donnice Barge, MD  Chest x-ray -  EKG -  will do at PST Stress Test -  ECHO -  Cardiac Cath -  CT Coronary Calcium score:   Bowel Prep - []  No  [x]   Yes _1 fleet enema morning DOS_  Pacemaker / ICD device [x]  No []  Yes   Spinal Cord Stimulator:[x]  No []  Yes       History of Sleep Apnea? [x]  No []  Yes   CPAP used?- [x]  No []  Yes    Does the patient monitor blood sugar?   []  N/A   []  No []  Yes  Patient has: []  NO Hx DM   [x]  Pre-DM   []  DM1  []   DM2     NO MEDS Last A1c was: 6.5 on  04-25-24     Blood Thinner / Instructions: none Aspirin Instructions:   none  ERAS Protocol Ordered: [x]  No  []  Yes Patient is to be NPO after: MN Prior  Dental hx: []  Dentures:  []  N/A      []  Bridge or Partial:                   []  Loose or Damaged teeth:   Comments: VM to [employer] regarding DOS diet ?NPO, patient arrives at 1145 for surgery at 1400  Activity level: Able to walk up 2 flights of stairs without becoming significantly short of breath or having chest pain?  []  No   []    Yes   Anesthesia review: HTN, Pre-DM, CKD3, ?LFTs  Patient denies any S&S of respiratory illness or Covid - no shortness of breath, fever, cough or chest pain at PAT appointment.  Patient verbalized understanding and agreement to the Pre-Surgical Instructions that were given to them at this PAT appointment. Patient was also educated of the need to review these PAT instructions again prior to his surgery.I reviewed the appropriate phone numbers to call if they have any and questions or concerns.

## 2024-06-20 NOTE — Patient Instructions (Signed)
 SURGICAL WAITING ROOM VISITATION Patients having surgery or a procedure may have no more than 2 support people in the waiting area - these visitors may rotate in the visitor waiting room.   If the patient needs to stay at the hospital during part of their recovery, the visitor guidelines for inpatient rooms apply.  PRE-OP VISITATION  Pre-op nurse will coordinate an appropriate time for 1 support person to accompany the patient in pre-op.  This support person may not rotate.  This visitor will be contacted when the time is appropriate for the visitor to come back in the pre-op area.  Please refer to the [facility] website for the visitor guidelines for Inpatients (after your surgery is over and you are in a regular room).  You are not required to quarantine at this time prior to your surgery. However, you must do this: Hand Hygiene often Do NOT share personal items Notify your provider if you are in close contact with someone who has COVID or you develop fever 100.4 or greater, new onset of sneezing, cough, sore throat, shortness of breath or body aches.  If you test positive for Covid or have been in contact with anyone that has tested positive in the last 10 days please notify you surgeon.    Your procedure is scheduled on:  Wednesday  July 04, 2024  Report to [facility] Main Entrance: [address] entrance where the [employer] is available.   Report to admitting at:  11:45   AM  Call this number if you have any questions or problems the morning of surgery [phone]  DO NOT EAT OR DRINK ANYTHING AFTER MIDNIGHT THE NIGHT PRIOR TO YOUR SURGERY / PROCEDURE.   FOLLOW  ANY ADDITIONAL PRE OP INSTRUCTIONS YOU RECEIVED FROM YOUR SURGEON'S OFFICE!!!  FLEET ENEMA: Obtain one(1) Fleet Enema (sodium phosphate 7-19 gm / 118 ml enema) and use (according to the directions on the box) the morning of your surgery.     Oral Hygiene is also important to reduce your risk of infection.         Remember - BRUSH YOUR TEETH THE MORNING OF SURGERY WITH YOUR REGULAR TOOTHPASTE  Do NOT smoke after Midnight the night before surgery.  STOP TAKING all Vitamins, Herbs and supplements 1 week before your surgery.   Take ONLY these medicines the morning of surgery with A SIP OF WATER: NONE                  You may not have any metal on your body including , jewelry, and body piercing  Do not wear lotions, powders, cologne, or deodorant  Men may shave face and neck.  Contacts, Hearing Aids, dentures or bridgework may not be worn into surgery. DENTURES WILL BE REMOVED PRIOR TO SURGERY PLEASE DO NOT APPLY Poly grip OR ADHESIVES!!!  Patients discharged on the day of surgery will not be allowed to drive home.  Someone NEEDS to stay with you for the first 24 hours after anesthesia.  Do not bring your home medications to the hospital. The Pharmacy will dispense medications listed on your medication list to you during your admission in the Hospital.  Please read over the following fact sheets you were given: IF YOU HAVE QUESTIONS ABOUT YOUR PRE-OP INSTRUCTIONS, PLEASE CALL [phone].   [facility] - Preparing for Surgery Before surgery, you can play an important role.  Because skin is not sterile, your skin needs to be as free of germs as possible.  You can reduce the number of germs on your skin by washing with CHG (chlorahexidine gluconate) soap before surgery.  CHG is an antiseptic cleaner which kills germs and bonds with the skin to continue killing germs even after washing. Please DO NOT use if you have an allergy to CHG or antibacterial soaps.  If your skin becomes reddened/irritated stop using the CHG and inform your nurse when you arrive at Short Stay. Do not shave (including legs and underarms) for at least 48 hours prior to the first CHG shower.  You may shave your face/neck.  Please follow these instructions carefully:  1.  Shower with CHG Soap the night before surgery and the   morning of surgery.  2.  If you choose to wash your hair, wash your hair first as usual with your normal  shampoo.  3.  After you shampoo, rinse your hair and body thoroughly to remove the shampoo.                             4.  Use CHG as you would any other liquid soap.  You can apply chg directly to the skin and wash.  Gently with a scrungie or clean washcloth.  5.  Apply the CHG Soap to your body ONLY FROM THE NECK DOWN.   Do not use on face/ open                           Wound or open sores. Avoid contact with eyes, ears mouth and genitals (private parts).                       Wash face,  Genitals (private parts) with your normal soap.             6.  Wash thoroughly, paying special attention to the area where your  surgery  will be performed.  7.  Thoroughly rinse your body with warm water from the neck down.  8.  DO NOT shower/wash with your normal soap after using and rinsing off the CHG Soap.            9.  Pat yourself dry with a clean towel.            10.  Wear clean pajamas.            11.  Place clean sheets on your bed the night of your first shower and do not  sleep with pets.  ON THE DAY OF SURGERY : Do not apply any lotions/deodorants the morning of surgery.  Please wear clean clothes to the hospital/surgery center.    FAILURE TO FOLLOW THESE INSTRUCTIONS MAY RESULT IN THE CANCELLATION OF YOUR SURGERY  PATIENT SIGNATURE_________________________________  NURSE SIGNATURE__________________________________  ________________________________________________________________________

## 2024-06-21 ENCOUNTER — Other Ambulatory Visit: Payer: Self-pay

## 2024-06-21 ENCOUNTER — Encounter (HOSPITAL_COMMUNITY): Payer: Self-pay

## 2024-06-21 ENCOUNTER — Encounter (HOSPITAL_COMMUNITY)
Admission: RE | Admit: 2024-06-21 | Discharge: 2024-06-21 | Disposition: A | Discharge status: Home / Self Care | Source: Ambulatory Visit | Attending: Urology | Admitting: Urology

## 2024-06-21 VITALS — BP 128/84 | HR 80 | Temp 98.0°F | Resp 16 | Ht 70.0 in | Wt 237.0 lb

## 2024-06-21 DIAGNOSIS — N189 Chronic kidney disease, unspecified: Secondary | ICD-10-CM | POA: Diagnosis not present

## 2024-06-21 DIAGNOSIS — R7989 Other specified abnormal findings of blood chemistry: Secondary | ICD-10-CM | POA: Diagnosis not present

## 2024-06-21 DIAGNOSIS — Z01818 Encounter for other preprocedural examination: Secondary | ICD-10-CM | POA: Diagnosis not present

## 2024-06-21 DIAGNOSIS — C61 Malignant neoplasm of prostate: Secondary | ICD-10-CM | POA: Diagnosis not present

## 2024-06-21 DIAGNOSIS — I129 Hypertensive chronic kidney disease with stage 1 through stage 4 chronic kidney disease, or unspecified chronic kidney disease: Secondary | ICD-10-CM | POA: Insufficient documentation

## 2024-06-21 DIAGNOSIS — E1122 Type 2 diabetes mellitus with diabetic chronic kidney disease: Secondary | ICD-10-CM | POA: Diagnosis not present

## 2024-06-21 DIAGNOSIS — I10 Essential (primary) hypertension: Secondary | ICD-10-CM

## 2024-06-21 HISTORY — DX: Malignant (primary) neoplasm, unspecified: C80.1

## 2024-06-21 HISTORY — DX: Chronic kidney disease, unspecified: N18.9

## 2024-06-21 HISTORY — DX: Personal history of urinary calculi: Z87.442

## 2024-06-21 HISTORY — DX: Unspecified osteoarthritis, unspecified site: M19.90

## 2024-06-21 HISTORY — DX: Family history of other specified conditions: Z84.89

## 2024-06-21 HISTORY — DX: Prediabetes: R73.03

## 2024-06-21 LAB — COMPREHENSIVE METABOLIC PANEL WITH GFR
ALT: 40 U/L (ref 0–44)
AST: 28 U/L (ref 15–41)
Albumin: 3.7 g/dL (ref 3.5–5.0)
Alkaline Phosphatase: 65 U/L (ref 38–126)
Anion gap: 10 (ref 5–15)
BUN: 24 mg/dL — ABNORMAL HIGH (ref 8–23)
CO2: 23 mmol/L (ref 22–32)
Calcium: 9.6 mg/dL (ref 8.9–10.3)
Chloride: 104 mmol/L (ref 98–111)
Creatinine, Ser: 1.24 mg/dL (ref 0.61–1.24)
GFR, Estimated: >60 mL/min (ref >60)
Glucose, Bld: 140 mg/dL — ABNORMAL HIGH (ref 70–99)
Potassium: 4.1 mmol/L (ref 3.5–5.1)
Sodium: 137 mmol/L (ref 135–145)
Total Bilirubin: 1 mg/dL (ref 0.0–1.2)
Total Protein: 7.9 g/dL (ref 6.5–8.1)

## 2024-06-21 LAB — CBC
HCT: 39.7 % (ref 39.0–52.0)
Hemoglobin: 12.6 g/dL — ABNORMAL LOW (ref 13.0–17.0)
MCH: 28.6 pg (ref 26.0–34.0)
MCHC: 31.7 g/dL (ref 30.0–36.0)
MCV: 90 fL (ref 80.0–100.0)
Platelets: 239 K/uL (ref 150–400)
RBC: 4.41 MIL/uL (ref 4.22–5.81)
RDW: 14.4 % (ref 11.5–15.5)
WBC: 5.8 K/uL (ref 4.0–10.5)
nRBC: 0 % (ref 0.0–0.2)

## 2024-06-25 NOTE — Progress Notes (Signed)
 Anesthesia Chart Review   Case: [id number] Date/Time: 07/04/24 1345   Procedures:      INSERTION, RADIATION SOURCE, PROSTATE     INJECTION, HYDROGEL SPACER   Anesthesia type: General   Diagnosis: Prostate cancer (HCC) [C61]   Pre-op diagnosis: PROSTATE CANCER   Location: [facility] PROCEDURE [facility] / [facility]   Surgeons: Alvaro Ricardo KATHEE Mickey., MD       DISCUSSION:68 y.o. never smoker with h/o HTN, CKD, pre-diabetes, prostate cancer scheduled for above procedure 07/04/24 with Dr. Ricardo Alvaro.   Pt reports son has h/o MH. Pt reports son had a surgery as a child, reaction to anesthetics at that time and was told he had MH.  Son has never had muscle biopsy.  No other family members with MH.  Pt has received anesthetics in the past, no documentation in Epic.  Reports precautions have always been used given son's diagnosis.   Pt reports he can climb a flight of stairs without difficulty, denies shortness of breath or chest pain.  VS: BP 128/84 Comment: right arm sitting  Pulse 80   Temp 36.7 C (Oral)   Resp 16   Ht 5' 10 (1.778 m)   Wt 107.5 kg   SpO2 100%   BMI 34.01 kg/m   PROVIDERS: Avva, Ravisankar, MD is PCP    LABS: Labs reviewed: Acceptable for surgery. (all labs ordered are listed, but only abnormal results are displayed)  Labs Reviewed  COMPREHENSIVE METABOLIC PANEL WITH GFR - Abnormal; Notable for the following components:      Result Value   Glucose, Bld 140 (*)    BUN 24 (*)    All other components within normal limits  CBC - Abnormal; Notable for the following components:   Hemoglobin 12.6 (*)    All other components within normal limits     IMAGES:   EKG:   CV:  Past Medical History:  Diagnosis Date   Arthritis    Cancer (HCC)    Prostate Cancer   Chronic kidney disease    Elevated PSA    Family history of adverse reaction to anesthesia    SON HAS MALIGNANT HYPERTHERMIA,  No one else in family has it per wife.   Hepatitis 2010   Hepatitis per PCP,    History of kidney stones    Hyperlipidemia    Hypertension    Obesity    Pre-diabetes     Past Surgical History:  Procedure Laterality Date   PROSTATE BIOPSY     shoulder surgery Left 2014   crushed shoulder repair   SHOULDER SURGERY Right 2010   ligament repair   TONSILLECTOMY      MEDICATIONS:  atorvastatin (LIPITOR) 20 MG tablet   leuprolide , 6 Month, (LEUPROLIDE  ACETATE, 6 MONTH,) 45 MG injection   valsartan-hydrochlorothiazide (DIOVAN-HCT) 320-25 MG tablet   No current facility-administered medications for this encounter.    Harlene Hoots Ward, PA-C [facility] Pre-Surgical Testing [phone]

## 2024-06-25 NOTE — Anesthesia Preprocedure Evaluation (Addendum)
 Anesthesia Evaluation  Patient identified by MRN, date of birth, ID band Patient awake    Reviewed: Allergy & Precautions, H&P , NPO status , Patient's Chart, lab work & pertinent test results  Airway Mallampati: II  TM Distance: >3 FB Neck ROM: Full    Dental no notable dental hx. (+) Dental Advisory Given, Missing, Loose, Chipped, Poor Dentition   Pulmonary neg pulmonary ROS   Pulmonary exam normal breath sounds clear to auscultation       Cardiovascular Exercise Tolerance: Good hypertension, Pt. on medications negative cardio ROS Normal cardiovascular exam Rhythm:Regular Rate:Normal     Neuro/Psych negative neurological ROS  negative psych ROS   GI/Hepatic negative GI ROS, Neg liver ROS,,,  Endo/Other  negative endocrine ROSdiabetes, Type 2    Renal/GU negative Renal ROS  negative genitourinary   Musculoskeletal negative musculoskeletal ROS (+)    Abdominal   Peds negative pediatric ROS (+)  Hematology negative hematology ROS (+)   Anesthesia Other Findings   Reproductive/Obstetrics negative OB ROS                              Anesthesia Physical Anesthesia Plan  ASA: 3  Anesthesia Plan: General   Post-op Pain Management: Ofirmev  IV (intra-op)* and Toradol IV (intra-op)*   Induction: Intravenous  PONV Risk Score and Plan: 2 and TIVA and Ondansetron   Airway Management Planned: LMA  Additional Equipment:   Intra-op Plan:   Post-operative Plan: Extubation in OR  Informed Consent: I have reviewed the patients History and Physical, chart, labs and discussed the procedure including the risks, benefits and alternatives for the proposed anesthesia with the patient or authorized representative who has indicated his/her understanding and acceptance.       Plan Discussed with: Anesthesiologist and CRNA  Anesthesia Plan Comments: (See PAT note 06/21/2024  DISCUSSION:68 y.o.  never smoker with h/o HTN, CKD, pre-diabetes, prostate cancer scheduled for above procedure 07/04/24 with Dr. Ricardo Likens.    Pt reports son has h/o MH. Pt reports son had a surgery as a child, reaction to anesthetics at that time and was told he had MH.  Son has never had muscle biopsy.  No other family members with MH.  Pt has received anesthetics in the past, no documentation in Epic.  Reports precautions have always been used given son's diagnosis.    )         Anesthesia Quick Evaluation

## 2024-07-03 ENCOUNTER — Telehealth: Payer: Self-pay | Admitting: *Deleted

## 2024-07-03 NOTE — Telephone Encounter (Signed)
 CALLED PATIENT TO REMIND OF PROCEDURE FOR 07-04-24, SPOKE WITH PATIENT AND HE IS AWARE OF THIS PROCEDURE

## 2024-07-04 ENCOUNTER — Ambulatory Visit (HOSPITAL_COMMUNITY)

## 2024-07-04 ENCOUNTER — Encounter (HOSPITAL_COMMUNITY)
Admission: RE | Disposition: A | Discharge status: Home / Self Care | Payer: Self-pay | Source: Home / Self Care | Attending: Urology

## 2024-07-04 ENCOUNTER — Ambulatory Visit (HOSPITAL_COMMUNITY): Payer: Self-pay | Admitting: Physician Assistant

## 2024-07-04 ENCOUNTER — Ambulatory Visit (HOSPITAL_BASED_OUTPATIENT_CLINIC_OR_DEPARTMENT_OTHER): Payer: Self-pay | Admitting: Anesthesiology

## 2024-07-04 ENCOUNTER — Encounter (HOSPITAL_COMMUNITY): Payer: Self-pay | Admitting: Urology

## 2024-07-04 ENCOUNTER — Ambulatory Visit (HOSPITAL_COMMUNITY)
Admission: RE | Admit: 2024-07-04 | Discharge: 2024-07-04 | Disposition: A | Discharge status: Home / Self Care | Attending: Urology | Admitting: Urology

## 2024-07-04 DIAGNOSIS — N189 Chronic kidney disease, unspecified: Secondary | ICD-10-CM | POA: Diagnosis not present

## 2024-07-04 DIAGNOSIS — E1122 Type 2 diabetes mellitus with diabetic chronic kidney disease: Secondary | ICD-10-CM | POA: Diagnosis not present

## 2024-07-04 DIAGNOSIS — I129 Hypertensive chronic kidney disease with stage 1 through stage 4 chronic kidney disease, or unspecified chronic kidney disease: Secondary | ICD-10-CM | POA: Diagnosis not present

## 2024-07-04 DIAGNOSIS — C61 Malignant neoplasm of prostate: Secondary | ICD-10-CM | POA: Diagnosis not present

## 2024-07-04 DIAGNOSIS — Z79899 Other long term (current) drug therapy: Secondary | ICD-10-CM | POA: Diagnosis not present

## 2024-07-04 DIAGNOSIS — E669 Obesity, unspecified: Secondary | ICD-10-CM | POA: Insufficient documentation

## 2024-07-04 DIAGNOSIS — Z191 Hormone sensitive malignancy status: Secondary | ICD-10-CM | POA: Diagnosis not present

## 2024-07-04 DIAGNOSIS — W448XXA Other foreign body entering into or through a natural orifice, initial encounter: Secondary | ICD-10-CM | POA: Insufficient documentation

## 2024-07-04 DIAGNOSIS — T191XXA Foreign body in bladder, initial encounter: Secondary | ICD-10-CM | POA: Diagnosis not present

## 2024-07-04 DIAGNOSIS — Z923 Personal history of irradiation: Secondary | ICD-10-CM | POA: Diagnosis not present

## 2024-07-04 DIAGNOSIS — Z6834 Body mass index (BMI) 34.0-34.9, adult: Secondary | ICD-10-CM | POA: Diagnosis not present

## 2024-07-04 HISTORY — PX: RADIOACTIVE SEED IMPLANT: SHX5150

## 2024-07-04 HISTORY — PX: SPACE OAR INSTILLATION: SHX6769

## 2024-07-04 SURGERY — INSERTION, RADIATION SOURCE, PROSTATE
Anesthesia: General

## 2024-07-04 MED ORDER — PROPOFOL 1000 MG/100ML IV EMUL
INTRAVENOUS | Status: AC
  Filled 2024-07-04: qty 100

## 2024-07-04 MED ORDER — CIPROFLOXACIN IN D5W 400 MG/200ML IV SOLN
400.0000 mg | INTRAVENOUS | Status: DC
  Filled 2024-07-04: qty 200

## 2024-07-04 MED ORDER — FLEET ENEMA RE ENEM: 1.0000 | ENEMA | Freq: Once | RECTAL | Status: DC

## 2024-07-04 MED ORDER — FENTANYL CITRATE (PF) 100 MCG/2ML IJ SOLN
INTRAMUSCULAR | Status: AC
Start: 2024-07-04 — End: 2024-07-04
  Filled 2024-07-04: qty 2

## 2024-07-04 MED ORDER — SUGAMMADEX SODIUM 200 MG/2ML IV SOLN
INTRAVENOUS | Status: AC
  Filled 2024-07-04: qty 2

## 2024-07-04 MED ORDER — LIDOCAINE HCL (PF) 2 % IJ SOLN
INTRAMUSCULAR | Status: AC
  Filled 2024-07-04: qty 5

## 2024-07-04 MED ORDER — CELECOXIB 200 MG PO CAPS
200.0000 mg | ORAL_CAPSULE | Freq: Once | ORAL | Status: AC
  Administered 2024-07-04: 200 mg via ORAL
  Filled 2024-07-04: qty 1

## 2024-07-04 MED ORDER — MEPERIDINE HCL 25 MG/ML IJ SOLN: 6.2500 mg | INTRAMUSCULAR | Status: DC | PRN

## 2024-07-04 MED ORDER — PROPOFOL 500 MG/50ML IV EMUL
INTRAVENOUS | Status: AC
  Filled 2024-07-04: qty 50

## 2024-07-04 MED ORDER — TAMSULOSIN HCL 0.4 MG PO CAPS
0.4000 mg | ORAL_CAPSULE | Freq: Every day | ORAL | 3 refills | Status: AC | PRN
Start: 2024-07-04 — End: ?

## 2024-07-04 MED ORDER — CIPROFLOXACIN IN D5W 400 MG/200ML IV SOLN
INTRAVENOUS | Status: DC | PRN
  Administered 2024-07-04: 400 mg via INTRAVENOUS

## 2024-07-04 MED ORDER — SODIUM CHLORIDE (PF) 0.9 % IJ SOLN
INTRAMUSCULAR | Status: DC | PRN
Start: 2024-07-04 — End: 2024-07-04
  Administered 2024-07-04: 10 mL

## 2024-07-04 MED ORDER — ONDANSETRON HCL 4 MG/2ML IJ SOLN
INTRAMUSCULAR | Status: AC
  Filled 2024-07-04: qty 2

## 2024-07-04 MED ORDER — DEXMEDETOMIDINE HCL IN NACL 200 MCG/50ML IV SOLN
INTRAVENOUS | Status: DC | PRN
  Administered 2024-07-04 (×3): 4 ug via INTRAVENOUS

## 2024-07-04 MED ORDER — CHLORHEXIDINE GLUCONATE 0.12 % MT SOLN
15.0000 mL | Freq: Once | OROMUCOSAL | Status: AC
  Administered 2024-07-04: 15 mL via OROMUCOSAL

## 2024-07-04 MED ORDER — LACTATED RINGERS IV SOLN: INTRAVENOUS | Status: DC

## 2024-07-04 MED ORDER — PHENYLEPHRINE HCL (PRESSORS) 10 MG/ML IV SOLN
INTRAVENOUS | Status: AC
  Filled 2024-07-04: qty 1

## 2024-07-04 MED ORDER — PROPOFOL 10 MG/ML IV BOLUS
INTRAVENOUS | Status: AC
  Filled 2024-07-04: qty 20

## 2024-07-04 MED ORDER — ALBUMIN HUMAN 5 % IV SOLN
INTRAVENOUS | Status: AC
  Filled 2024-07-04: qty 250

## 2024-07-04 MED ORDER — SUGAMMADEX SODIUM 200 MG/2ML IV SOLN
INTRAVENOUS | Status: DC | PRN
  Administered 2024-07-04: 200 mg via INTRAVENOUS

## 2024-07-04 MED ORDER — ESMOLOL HCL 100 MG/10ML IV SOLN
INTRAVENOUS | Status: AC
  Filled 2024-07-04: qty 10

## 2024-07-04 MED ORDER — DEXAMETHASONE SODIUM PHOSPHATE 10 MG/ML IJ SOLN
INTRAMUSCULAR | Status: DC | PRN
  Administered 2024-07-04: 8 mg via INTRAVENOUS

## 2024-07-04 MED ORDER — FENTANYL CITRATE (PF) 100 MCG/2ML IJ SOLN
INTRAMUSCULAR | Status: DC | PRN
  Administered 2024-07-04: 100 ug via INTRAVENOUS

## 2024-07-04 MED ORDER — ORAL CARE MOUTH RINSE: 15.0000 mL | Freq: Once | OROMUCOSAL | Status: AC

## 2024-07-04 MED ORDER — MIDAZOLAM HCL 2 MG/2ML IJ SOLN
INTRAMUSCULAR | Status: DC | PRN
  Administered 2024-07-04: 2 mg via INTRAVENOUS

## 2024-07-04 MED ORDER — PHENYLEPHRINE HCL (PRESSORS) 10 MG/ML IV SOLN
INTRAVENOUS | Status: DC | PRN
  Administered 2024-07-04: 80 ug via INTRAVENOUS

## 2024-07-04 MED ORDER — PROPOFOL 10 MG/ML IV BOLUS
INTRAVENOUS | Status: DC | PRN
  Administered 2024-07-04: 200 mg via INTRAVENOUS
  Administered 2024-07-04: 150 ug/kg/min via INTRAVENOUS

## 2024-07-04 MED ORDER — TRAMADOL HCL 50 MG PO TABS: 50.0000 mg | ORAL_TABLET | Freq: Four times a day (QID) | ORAL | 0 refills | Status: AC | PRN

## 2024-07-04 MED ORDER — ONDANSETRON HCL 4 MG/2ML IJ SOLN: 4.0000 mg | Freq: Once | INTRAMUSCULAR | Status: DC | PRN

## 2024-07-04 MED ORDER — ROCURONIUM BROMIDE 10 MG/ML (PF) SYRINGE
PREFILLED_SYRINGE | INTRAVENOUS | Status: DC | PRN
  Administered 2024-07-04: 60 mg via INTRAVENOUS

## 2024-07-04 MED ORDER — FENTANYL CITRATE PF 50 MCG/ML IJ SOSY: 25.0000 ug | PREFILLED_SYRINGE | INTRAMUSCULAR | Status: DC | PRN

## 2024-07-04 MED ORDER — LACTATED RINGERS IV SOLN
INTRAVENOUS | Status: DC | PRN
Start: 2024-07-04 — End: 2024-07-04

## 2024-07-04 MED ORDER — ACETAMINOPHEN 500 MG PO TABS
1000.0000 mg | ORAL_TABLET | Freq: Once | ORAL | Status: AC
  Administered 2024-07-04: 1000 mg via ORAL
  Filled 2024-07-04: qty 2

## 2024-07-04 MED ORDER — OXYCODONE HCL 5 MG/5ML PO SOLN: 5.0000 mg | Freq: Once | ORAL | Status: DC | PRN

## 2024-07-04 MED ORDER — ALBUMIN HUMAN 5 % IV SOLN: INTRAVENOUS | Status: DC | PRN

## 2024-07-04 MED ORDER — IOHEXOL 300 MG/ML  SOLN
INTRAMUSCULAR | Status: DC | PRN
  Administered 2024-07-04: 7 mL

## 2024-07-04 MED ORDER — SODIUM CHLORIDE 0.9 % IR SOLN
Status: DC | PRN
  Administered 2024-07-04: 3000 mL via INTRAVESICAL

## 2024-07-04 MED ORDER — OXYCODONE HCL 5 MG PO TABS: 5.0000 mg | ORAL_TABLET | Freq: Once | ORAL | Status: DC | PRN

## 2024-07-04 MED ORDER — MIDAZOLAM HCL 2 MG/2ML IJ SOLN
INTRAMUSCULAR | Status: AC
  Filled 2024-07-04: qty 2

## 2024-07-04 MED ORDER — ONDANSETRON HCL 4 MG/2ML IJ SOLN
INTRAMUSCULAR | Status: DC | PRN
  Administered 2024-07-04: 4 mg via INTRAVENOUS

## 2024-07-04 SURGICAL SUPPLY — 30 items
BAG COUNTER SPONGE SURGICOUNT (BAG) IMPLANT
BAG URINE DRAIN 2000ML AR STRL (UROLOGICAL SUPPLIES) ×1 IMPLANT
CATH FOLEY 2WAY SLVR 5CC 16FR (CATHETERS) ×2 IMPLANT
CATH ROBINSON RED A/P 20FR (CATHETERS) ×1 IMPLANT
COVER MAYO STAND STRL (DRAPES) ×1 IMPLANT
DRAPE U-SHAPE 47X51 STRL (DRAPES) ×1 IMPLANT
DRSG TEGADERM 4X4.75 (GAUZE/BANDAGES/DRESSINGS) ×2 IMPLANT
DRSG TEGADERM 8X12 (GAUZE/BANDAGES/DRESSINGS) ×2 IMPLANT
GLOVE BIO SURGEON STRL SZ7.5 (GLOVE) ×1 IMPLANT
GLOVE ECLIPSE 8.0 STRL XLNG CF (GLOVE) ×1 IMPLANT
GLOVE SURG LX STRL 7.5 STRW (GLOVE) ×2 IMPLANT
GOWN SRG XL LVL 4 BRTHBL STRL (GOWNS) ×1 IMPLANT
GRID BRACH TEMP 18GA 2.8X3X.75 (MISCELLANEOUS) IMPLANT
HOLDER FOLEY CATH W/STRAP (MISCELLANEOUS) ×1 IMPLANT
IMPL SPACEOAR SYSTEM 10ML (Spacer) ×1 IMPLANT
KIT TURNOVER KIT A (KITS) ×1 IMPLANT
MARKER SKIN DUAL TIP RULER LAB (MISCELLANEOUS) ×1 IMPLANT
NDL BRACHY 18G 5PK (NEEDLE) ×1 IMPLANT
NDL BRACHY 18G SINGLE (NEEDLE) ×1 IMPLANT
NDL PK MORGANSTERN STABILIZ (NEEDLE) IMPLANT
NEEDLE BRACHY 18G 5PK (NEEDLE) ×1 IMPLANT
NEEDLE BRACHY 18G SINGLE (NEEDLE) ×1 IMPLANT
NEEDLE PK MORGANSTERN STABILIZ (NEEDLE) IMPLANT
PACK CYSTO (CUSTOM PROCEDURE TRAY) ×1 IMPLANT
PENCIL SMOKE EVACUATOR (MISCELLANEOUS) IMPLANT
SURGILUBE 2OZ TUBE FLIPTOP (MISCELLANEOUS) ×1 IMPLANT
SYR 10ML LL (SYRINGE) ×1 IMPLANT
TOWEL OR 17X26 10 PK STRL BLUE (TOWEL DISPOSABLE) ×1 IMPLANT
UNDERPAD 30X36 HEAVY ABSORB (UNDERPADS AND DIAPERS) ×2 IMPLANT
brachy source 1-125 implant seed IMPLANT

## 2024-07-04 NOTE — H&P (Signed)
 Joseph Cox is an 68 y.o. male.    Chief Complaint: Pre-Op Prostate brachytherapy and SPACE-OAR  HPI:   1 - High Risk Prostate Cancer - 4/12 cores up to 50% grade 4 cancer on eval rising PSA to 9.2 01/2024. TRUS BX 57 mL, no median. African American. NO FHX prostate cancer. PET 02/2024 localized (equivocal Rt 4th rib uptake only). Initial plan of brachy + external beam radiation + 2 years androgen deprivation.   Recent Summarized Course:  03/2024 - Eligard  45 (1/4); 06/2024 PSA 0.56, T 12, CMP OK   PMH sig for shoulder surgery, HLD, obesity, son had malignant hyperthermia once. NO ischemic CV disease / blood thinners. NO chest/abd surgery. he is retired Nature conservation officer with [employer], wife very involved, 3 sons in area, 2 granddaughters. His PCP is Fredia Overcast MD.   Today  [name] is seen to proceed with prostate brachytherapy and SPACE-OAR. No interval fevers.    Past Medical History:  Diagnosis Date   Arthritis    Cancer [facility])    Prostate Cancer   Chronic kidney disease    Elevated PSA    Family history of adverse reaction to anesthesia    SON HAS MALIGNANT HYPERTHERMIA,  No one else in family has it per wife.   Hepatitis 2010   Hepatitis per PCP,   History of kidney stones    Hyperlipidemia    Hypertension    Obesity    Pre-diabetes     Past Surgical History:  Procedure Laterality Date   PROSTATE BIOPSY     shoulder surgery Left 2014   crushed shoulder repair   SHOULDER SURGERY Right 2010   ligament repair   TONSILLECTOMY      Family History  Problem Relation Age of Onset   Diabetes Mother    Hypertension Father    Diabetes Father    Hypertension Sister    Diabetes Sister    Social History:  reports that he has never smoked. He has never used smokeless tobacco. He reports that he does not drink alcohol  and does not use drugs.  Allergies:  Allergies  Allergen Reactions   Percocet [Oxycodone -Acetaminophen ]     syncope    No medications prior to admission.    No  results found for this or any previous visit (from the past 48 hours). No results found.  Review of Systems  Constitutional:  Negative for chills and fever.  All other systems reviewed and are negative.   There were no vitals taken for this visit. Physical Exam Vitals reviewed.  HENT:     Head: Normocephalic.  Eyes:     Pupils: Pupils are equal, round, and reactive to light.  Cardiovascular:     Rate and Rhythm: Normal rate.  Pulmonary:     Effort: Pulmonary effort is normal.  Abdominal:     General: Abdomen is flat.  Genitourinary:    Comments: No CVAT at present Musculoskeletal:        General: Normal range of motion.     Cervical back: Normal range of motion.  Skin:    General: Skin is warm.  Neurological:     General: No focal deficit present.     Mental Status: He is alert.  Psychiatric:        Mood and Affect: Mood normal.      Assessment/Plan  Proceed as planned with prostate brachytherapy and SPACE-OAR as part of multi-modal therapy for high risk prostate cancer. Risks, benefits, alternatives, expected peri-op course discussed  preivousliy and reiterated today.   Ricardo KATHEE Alvaro Mickey., MD 07/04/2024, 7:30 AM

## 2024-07-04 NOTE — Anesthesia Procedure Notes (Signed)
 Procedure Name: Intubation Date/Time: 07/04/2024 2:46 PM  Performed by: Obadiah Reyes BROCKS, CRNAPre-anesthesia Checklist: Patient identified, Emergency Drugs available, Suction available and Patient being monitored Patient Re-evaluated:Patient Re-evaluated prior to induction Oxygen Delivery Method: Circle System Utilized Preoxygenation: Pre-oxygenation with 100% oxygen Induction Type: IV induction Ventilation: Mask ventilation without difficulty Laryngoscope Size: Glidescope Grade View: Grade III Tube type: Oral Tube size: 7.5 mm Number of attempts: 1 Airway Equipment and Method: Stylet and Oral airway Placement Confirmation: ETT inserted through vocal cords under direct vision, positive ETCO2 and breath sounds checked- equal and bilateral Secured at: 23 cm Tube secured with: Tape Dental Injury: Teeth and Oropharynx as per pre-operative assessment  Difficulty Due To: Difficulty was unanticipated

## 2024-07-04 NOTE — Discharge Instructions (Addendum)
1 - You may have urinary urgency (bladder spasms) and bloody urine on / off for up to 2 weeks.  This is normal.  2 - Call MD or go to ER for fever >102, severe pain / nausea / vomiting not relieved by medications, or acute change in medical status  

## 2024-07-04 NOTE — Transfer of Care (Signed)
 Immediate Anesthesia Transfer of Care Note  Patient: Joseph Cox  Procedure(s) Performed: INSERTION, RADIATION SOURCE, PROSTATE INJECTION, HYDROGEL SPACER  Patient Location: PACU  Anesthesia Type:General  Level of Consciousness: sedated and responds to stimulation  Airway & Oxygen Therapy: Patient Spontanous Breathing and Patient connected to face mask oxygen  Post-op Assessment: Report given to RN and Post -op Vital signs reviewed and stable  Post vital signs: Reviewed and stable  Last Vitals:  Vitals Value Taken Time  BP 111/80 07/04/24 16:45  Temp    Pulse 78 07/04/24 16:46  Resp 13 07/04/24 16:46  SpO2 96 % 07/04/24 16:46  Vitals shown include unfiled device data.  Last Pain:  Vitals:   07/04/24 1233  TempSrc:   PainSc: 0-No pain         Complications: No notable events documented.

## 2024-07-04 NOTE — Brief Op Note (Signed)
 07/04/2024  4:18 PM  PATIENT:  Joseph Cox  68 y.o. male  PRE-OPERATIVE DIAGNOSIS:  PROSTATE CANCER  POST-OPERATIVE DIAGNOSIS:  prostate cancer  PROCEDURE:  Procedure(s): INSERTION, RADIATION SOURCE, PROSTATE (N/A) INJECTION, HYDROGEL SPACER (N/A)  SURGEON:  Surgeons and Role:    * Manny, Ricardo KATHEE Raddle., MD - Primary    * Patrcia Cough, MD  PHYSICIAN ASSISTANT:   ASSISTANTS: none   ANESTHESIA:   general  EBL:  minimal   BLOOD ADMINISTERED:none  DRAINS: none   LOCAL MEDICATIONS USED:  NONE  SPECIMEN:  Source of Specimen:  brachytherapy seed  DISPOSITION OF SPECIMEN:  radiation oncology staff  COUNTS:  YES  TOURNIQUET:  * No tourniquets in log *  DICTATION: .Other Dictation: Dictation Number [id number]  PLAN OF CARE: Discharge to home after PACU  PATIENT DISPOSITION:  PACU - hemodynamically stable.   Delay start of Pharmacological VTE agent (>24hrs) due to surgical blood loss or risk of bleeding: yes

## 2024-07-04 NOTE — Progress Notes (Signed)
  Radiation Oncology         [phone] ________________________________  Name: Joseph Cox MRN: [id number]  Date: 07/04/2024  DOB: October 07, 1956       Prostate Seed Implant  RR:Jccj, Ravisankar, MD  No ref. provider found  DIAGNOSIS:  68 y.o. 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   Oncology History  Malignant neoplasm of prostate (HCC)  01/16/2024 Cancer Staging   Staging form: Prostate, AJCC 8th Edition - Clinical stage from 01/16/2024: Stage IVB (cT1c, cN0, cM1b, PSA: 9.2, Grade Group: 4) - Signed by Sherwood Rise, PA-C on 02/27/2024 Histopathologic type: Adenocarcinoma, NOS Stage prefix: Initial diagnosis Prostate specific antigen (PSA) range: Less than 10 Gleason primary pattern: 4 Gleason secondary pattern: 4 Gleason score: 8 Histologic grading system: 5 grade system Number of biopsy cores examined: 12 Number of biopsy cores positive: 4 Location of positive needle core biopsies: Both sides   02/27/2024 Initial Diagnosis   Malignant neoplasm of prostate (HCC)     No diagnosis found.  PROCEDURE: Insertion of radioactive I-125 seeds into the prostate gland.  RADIATION DOSE: 110 Gy, boost therapy.  TECHNIQUE: Joseph Cox was brought to the operating room with the urologist. He was placed in the dorsolithotomy position. He was catheterized and a rectal tube was inserted. The perineum was shaved, prepped and draped. The ultrasound probe was then introduced by me into the rectum to see the prostate gland.  TREATMENT DEVICE: I attached the needle grid to the ultrasound probe stand and anchor needles were placed.  3D PLANNING: The prostate was imaged in 3D using a sagittal sweep of the prostate probe. These images were transferred to the planning computer. There, the prostate, urethra and rectum were defined on each axial reconstructed image. Then, the software created an optimized 3D plan and a few seed positions were  adjusted. The quality of the plan was reviewed using [facility] information for the target and the following two organs at risk:  Urethra and Rectum.  Then the accepted plan was printed and handed off to the radiation therapist.  Under my supervision, the custom loading of the seeds and spacers was carried out using the quick loader.  These pre-loaded needles were then placed into the needle holder.SABRA  PROSTATE VOLUME STUDY:  Using transrectal ultrasound the volume of the prostate was verified to be 28.8 cc.  SPECIAL TREATMENT PROCEDURE/SUPERVISION AND HANDLING: The pre-loaded needles were then delivered by the urologist under sagittal guidance. A total of 17 needles were used to deposit 50 seeds in the prostate gland. The individual seed activity was 0.338 mCi.  SpaceOAR:  Yes  COMPLEX SIMULATION: At the end of the procedure, an anterior radiograph of the pelvis was obtained to document seed positioning and count. Cystoscopy was performed by the urologist to check the urethra and bladder.  MICRODOSIMETRY: At the end of the procedure, the patient was emitting 0.024 mR/hr at 1 meter. Accordingly, he was considered safe for hospital discharge.  PLAN: The patient will return to the radiation oncology clinic for post implant CT dosimetry in three weeks.   ________________________________  Joseph Cox, M.D.

## 2024-07-04 NOTE — Anesthesia Postprocedure Evaluation (Signed)
 Anesthesia Post Note  Patient: Joseph Cox  Procedure(s) Performed: INSERTION, RADIATION SOURCE, PROSTATE INJECTION, HYDROGEL SPACER     Patient location during evaluation: PACU Anesthesia Type: General Level of consciousness: awake and alert Pain management: pain level controlled Vital Signs Assessment: post-procedure vital signs reviewed and stable Respiratory status: spontaneous breathing, nonlabored ventilation, respiratory function stable and patient connected to nasal cannula oxygen Cardiovascular status: blood pressure returned to baseline and stable Postop Assessment: no apparent nausea or vomiting Anesthetic complications: no   No notable events documented.  Last Vitals:  Vitals:   07/04/24 1715 07/04/24 1751  BP: 134/89 (!) 139/93  Pulse: 85 89  Resp: 14 16  Temp:  36.6 C  SpO2: 92% 93%    Last Pain:  Vitals:   07/04/24 1751  TempSrc:   PainSc: 0-No pain                 Rella Egelston

## 2024-07-05 ENCOUNTER — Encounter (HOSPITAL_COMMUNITY): Payer: Self-pay | Admitting: Urology

## 2024-07-05 NOTE — Op Note (Signed)
 NAME: Joseph Cox, Joseph Cox MEDICAL RECORD NO: [id number] ACCOUNT NO: [id number] DATE OF BIRTH: Mar 31, 1956 FACILITY: [facility] LOCATION: [facility] PHYSICIAN: Ricardo Likens, MD  Operative Report   DATE OF PROCEDURE: 07/04/2024  SURGEON:  Ricardo Likens, MD.  PREOPERATIVE DIAGNOSIS:  High-risk adenocarcinoma of the prostate.  POSTOPERATIVE DIAGNOSIS:  High-risk adenocarcinoma of the prostate.  PROCEDURE PERFORMED: 1.  Brachytherapy seed implantation by ultrasound guidance. 2.  SpaceOAR implantation. 3.  Cystoscopy with removal of foreign body.  ESTIMATED BLOOD LOSS: Nil.  COMPLICATIONS: None.  SPECIMENS:  Two radiation seeds disposed off to radiation oncology staff.  FINDINGS: 1.  Excellent posterior displacement of rectum away from prostate with SpaceOAR gel matrix placement. 2.  Two radiation seeds noted in the urinary bladder following implantation.  These were removed and set aside for appropriate disposal. 3.  Brachytherapy plan, 17 catheters delivering 50 sources. Final number source is 48.  INDICATIONS:  The patient is a very pleasant 68 year old man.  He was found on workup of elevated PSA to have multifocal high-risk adenocarcinoma of the prostate.  This is clinically localized.  He is on a curative path with androgen deprivation plus  combination external beam and brachytherapy radiation with curative intent. He has already had a good PSA response to androgen deprivation.  He presents today for brachytherapy boost to his prostate as far as multimodal treatment in conjunction with the  radiation oncology staff. Informed consent and placed in the medical record.  PROCEDURE IN DETAIL:  The patient being Joseph Cox verified and procedure being brachytherapy seed implantation, SpaceOAR and cystoscopy was confirmed. Procedure timeout was performed.  Intravenous antibiotics administered.  General anesthesia induced.   The patient was placed into a medium lithotomy position.  A sterile field  was created.  Prepped and draped the penis, perineum, proximal thigh.  A Foley catheter was placed per urethra to straight drain. Transrectal ultrasound probe was introduced and  radiation planning performed as per radiation oncology note.  Plan called for 50 sources across 17 catheters for a prescribed dose of 110 Gy.  Next, the brachytherapy procedure was placed in transperineal approach in an anterior to posterior direction to  optimize ultrasound and visualization.  These were placed according to plan across the maintained catheters. Following this, the prostate anchor needles were removed.  The ultrasound was taken off of anterior tension and attention was directed at  [facility] placement.  Via transperineal approach, approximately 1 cm anterior rectal verge under ultrasound guidance, the SpaceOAR introducer needle was advanced to the anterior prerectal fat at the mid portion of the prostate.  This was corroborated using 2 mL of saline  hydrodissection.  Next, 10 mL of gel matrix was instilled as per manufacturer's guidelines over 10 seconds.  This resulted in excellent posterior displacement of the anterior rectal wall. Spot fluoroscopic images were obtained. Foley catheter was  removed.  Attention was directed to cystoscopy.  Cystourethroscopy was performed using initially a 16-French ____ cystoscope.  Inspection of the anterior and posterior urethra was unremarkable.  Inspection of the urinary bladder did reveal two radioactive seeds within the bladder. These appeared to be  on the right side, otherwise unremarkable and no additional findings of retroflexion. Given this, cystoscopy was then repeated with a 21-French rigid cystoscope with 30-degree offset lens and the two sources were retrieved sequentially and set aside with  radiation oncology staff for appropriate disposal.  Bladder was partially emptied per cystoscope. The procedure was then terminated.  The patient tolerated the procedure well.  No immediate periprocedural complications.  The patient was taken to  postanesthesia care unit in stable condition with plan for discharge home after void.   NIK D: 07/04/2024 4:24:34 pm T: 07/05/2024 12:26:00 am  JOB: [id number]/ [id number]

## 2024-07-09 ENCOUNTER — Other Ambulatory Visit: Payer: Self-pay | Admitting: Urology

## 2024-07-09 DIAGNOSIS — C61 Malignant neoplasm of prostate: Secondary | ICD-10-CM

## 2024-07-20 ENCOUNTER — Telehealth: Payer: Self-pay | Admitting: *Deleted

## 2024-07-20 NOTE — Telephone Encounter (Signed)
 Called patient to remind of MRI and sim/post seed appt. for 07-27-24, spoke with patient and he is aware of these appts. and the instructions

## 2024-07-27 ENCOUNTER — Ambulatory Visit
Admission: RE | Admit: 2024-07-27 | Discharge: 2024-07-27 | Disposition: A | Discharge status: Home / Self Care | Source: Ambulatory Visit | Attending: Radiation Oncology | Admitting: Radiation Oncology

## 2024-07-27 ENCOUNTER — Encounter (HOSPITAL_COMMUNITY): Payer: Self-pay | Admitting: Radiology

## 2024-07-27 ENCOUNTER — Ambulatory Visit (HOSPITAL_COMMUNITY)
Admission: RE | Admit: 2024-07-27 | Discharge: 2024-07-27 | Disposition: A | Discharge status: Home / Self Care | Source: Ambulatory Visit | Attending: Urology | Admitting: Urology

## 2024-07-27 DIAGNOSIS — C61 Malignant neoplasm of prostate: Secondary | ICD-10-CM | POA: Insufficient documentation

## 2024-07-27 DIAGNOSIS — Z510 Encounter for antineoplastic radiation therapy: Secondary | ICD-10-CM | POA: Insufficient documentation

## 2024-07-27 NOTE — Progress Notes (Signed)
  Radiation Oncology         [phone] ________________________________  Name: Joseph Cox MRN: [id number]  Date: 07/27/2024  DOB: 11/16/1956  SIMULATION AND TREATMENT PLANNING NOTE    ICD-10-CM   1. Malignant neoplasm of prostate (HCC)  C61       DIAGNOSIS:    68 y.o. 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   NARRATIVE:  The patient was brought to the CT Simulation planning suite.  Identity was confirmed.  All relevant records and images related to the planned course of therapy were reviewed.  The patient freely provided informed written consent to proceed with treatment after reviewing the details related to the planned course of therapy. The consent form was witnessed and verified by the simulation staff.  Then, the patient was set-up in a stable reproducible supine position for radiation therapy.  A vacuum lock pillow device was custom fabricated to position his legs in a reproducible immobilized position.  Then, I performed a urethrogram under sterile conditions to identify the prostatic apex.  CT images were obtained.  Surface markings were placed.  The CT images were loaded into the planning software.  Then the prostate target and avoidance structures including the rectum, bladder, bowel and hips were contoured.  Treatment planning then occurred.  The radiation prescription was entered and confirmed.  A total of one complex treatment devices were fabricated. I have requested : Intensity Modulated Radiotherapy (IMRT) is medically necessary for this case for the following reason:  Rectal sparing.SABRA  PLAN:  The patient will receive 45 Gy in 25 fractions of 1.8 Gy, to supplement an up-front prostate seed implant boost of 110 Gy to achieve a total nominal dose of 155 Gy.  ________________________________  Donnice FELIX Patrcia, M.D.

## 2024-07-27 NOTE — Progress Notes (Signed)
  Radiation Oncology         [phone] ________________________________  Name: Joseph Cox MRN: [id number]  Date: 07/27/2024  DOB: November 15, 1956  COMPLEX SIMULATION NOTE  NARRATIVE:  The patient was brought to the CT Simulation planning suite today following prostate seed implantation approximately one month ago.  Identity was confirmed.  All relevant records and images related to the planned course of therapy were reviewed.  Then, the patient was set-up supine.  CT images were obtained.  The CT images were loaded into the planning software.  Then the prostate and rectum were contoured.  Treatment planning then occurred.  The implanted iodine 125 seeds were identified by the physics staff for projection of radiation distribution  I have requested : 3D Simulation  I have requested a DVH of the following structures: Prostate and rectum.    ________________________________  Joseph Cox, M.D.

## 2024-07-31 DIAGNOSIS — Z191 Hormone sensitive malignancy status: Secondary | ICD-10-CM | POA: Diagnosis not present

## 2024-07-31 DIAGNOSIS — M899 Disorder of bone, unspecified: Secondary | ICD-10-CM | POA: Diagnosis not present

## 2024-07-31 DIAGNOSIS — C61 Malignant neoplasm of prostate: Secondary | ICD-10-CM | POA: Diagnosis not present

## 2024-08-06 ENCOUNTER — Encounter: Payer: Self-pay | Admitting: Radiation Oncology

## 2024-08-06 DIAGNOSIS — C61 Malignant neoplasm of prostate: Secondary | ICD-10-CM | POA: Diagnosis not present

## 2024-08-06 DIAGNOSIS — Z510 Encounter for antineoplastic radiation therapy: Secondary | ICD-10-CM | POA: Diagnosis not present

## 2024-08-06 NOTE — Progress Notes (Signed)
  Radiation Oncology         [phone] ________________________________  Name: Joseph Cox MRN: [id number]  Date: 08/06/2024  DOB: [birth year]  3D Planning Note   Prostate Brachytherapy Post-Implant Dosimetry  Diagnosis: 68 y.o. 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   Narrative: On a previous date, Joseph Cox returned following prostate seed implantation for post implant planning. He underwent CT scan complex simulation to delineate the three-dimensional structures of the pelvis and demonstrate the radiation distribution.  Since that time, the seed localization, and complex isodose planning with dose volume histograms have now been completed.  Results:   Prostate Coverage - The dose of radiation delivered to the 90% or more of the prostate gland (D90) was 99.77% of the prescription dose. This exceeds our goal of greater than 90%. Rectal Sparing - The volume of rectal tissue receiving the prescription dose or higher was 0.0 cc. This falls under our thresholds tolerance of 1.0 cc.  Impression: The prostate seed implant appears to show adequate target coverage and appropriate rectal sparing.  Plan:  The patient will continue to follow with urology for ongoing PSA determinations. I would anticipate a high likelihood for local tumor control with minimal risk for rectal morbidity.  ________________________________  Donnice FELIX Patrcia, M.D.

## 2024-08-07 DIAGNOSIS — C61 Malignant neoplasm of prostate: Secondary | ICD-10-CM | POA: Diagnosis not present

## 2024-08-07 DIAGNOSIS — Z191 Hormone sensitive malignancy status: Secondary | ICD-10-CM | POA: Diagnosis not present

## 2024-08-07 DIAGNOSIS — Z510 Encounter for antineoplastic radiation therapy: Secondary | ICD-10-CM | POA: Diagnosis not present

## 2024-08-08 ENCOUNTER — Ambulatory Visit
Admission: RE | Admit: 2024-08-08 | Discharge: 2024-08-08 | Disposition: A | Discharge status: Home / Self Care | Source: Ambulatory Visit | Attending: Radiation Oncology

## 2024-08-08 ENCOUNTER — Other Ambulatory Visit: Payer: Self-pay

## 2024-08-08 DIAGNOSIS — Z510 Encounter for antineoplastic radiation therapy: Secondary | ICD-10-CM | POA: Diagnosis not present

## 2024-08-08 DIAGNOSIS — C61 Malignant neoplasm of prostate: Secondary | ICD-10-CM | POA: Diagnosis not present

## 2024-08-08 LAB — RAD ONC ARIA SESSION SUMMARY
Course Elapsed Days: 0
Plan Fractions Treated to Date: 1
Plan Prescribed Dose Per Fraction: 1.8 Gy
Plan Total Fractions Prescribed: 25
Plan Total Prescribed Dose: 45 Gy
Reference Point Dosage Given to Date: 1.8 Gy
Reference Point Session Dosage Given: 1.8 Gy
Session Number: 1

## 2024-08-08 NOTE — Radiation Completion Notes (Signed)
 Patient Name: Joseph Cox, Joseph Cox MRN: [id number] Date of Birth: 10-Dec-1956 Referring Physician: RICARDO LIKENS, M.D. Date of Service: 2024-08-08 Radiation Oncologist: Adina Barge, M.D. [facility] [place]                             RADIATION ONCOLOGY END OF TREATMENT NOTE     Diagnosis: C61 Malignant neoplasm of prostate Staging on 2024-01-16: Malignant neoplasm of prostate (HCC) T=cT1c, N=cN0, M=cM1b Intent: Curative     ==========DELIVERED PLANS==========  First Treatment Date: 2024-08-01 Last Treatment Date: 2024-08-08   Plan Name: Prostate_Pelv Site: Prostate Technique: IMRT Mode: Photon Dose Per Fraction: 1.8 Gy Prescribed Dose (Delivered / Prescribed): 1.8 Gy / 45 Gy Prescribed Fxs (Delivered / Prescribed): 1 / 25   Plan Name: Prostate Seed Implant Site: Prostate Technique: Radioactive Seed Implant I-125 Mode: Brachytherapy Dose Per Fraction: 110 Gy Prescribed Dose (Delivered / Prescribed): 110 Gy / 110 Gy Prescribed Fxs (Delivered / Prescribed): 1 / 1     ==========ON TREATMENT VISIT DATES==========      ==========UPCOMING VISITS========== 09/12/2024 [facility] FINAL TREATMENT [facility] LINAC 3  09/11/2024 [facility] IMRT TREATMENT [facility] LINAC 3  09/10/2024 [facility] IMRT TREATMENT [facility] LINAC 3  09/07/2024 [facility] IMRT TREATMENT [facility] LINAC 3  09/06/2024 [facility] IMRT TREATMENT [facility] LINAC 3  09/05/2024 [facility] IMRT TREATMENT [facility] LINAC 3  09/04/2024 [facility] IMRT TREATMENT [facility] LINAC 3  09/03/2024 [facility] IMRT TREATMENT [facility] LINAC 3  08/31/2024 [facility] IMRT TREATMENT [facility] [id number]  08/30/2024 [facility] IMRT TREATMENT [facility] [id number]  08/29/2024 [facility] IMRT TREATMENT [facility] [id number]  08/28/2024 [facility] IMRT  TREATMENT [facility] LINAC 3  08/27/2024 [facility] IMRT TREATMENT [facility] LINAC 4  08/24/2024 [facility] IMRT TREATMENT [facility] LINAC 4  08/23/2024 [facility] IMRT TREATMENT [facility] LINAC 3  08/22/2024 [facility] IMRT TREATMENT [facility] LINAC 3  08/21/2024 [facility] IMRT TREATMENT [facility] LINAC 3  08/20/2024 [facility] IMRT TREATMENT [facility] LINAC 3  08/17/2024 [facility] ONC WEEKLY UT CHECK LINAC-MANNING  08/17/2024 [facility] IMRT TREATMENT [facility] LINAC 3  08/16/2024 [facility] IMRT TREATMENT [facility] [id number]  08/15/2024 [facility] IMRT TREATMENT [facility] [id number]  08/14/2024 [facility] IMRT TREATMENT [facility] [id number]  08/10/2024 [facility] ONC WEEKLY UT CHECK LINAC-MANNING  08/10/2024 [facility] IMRT TREATMENT [facility] LINAC 4  08/09/2024 [facility] IMRT TREATMENT [facility] LINAC 3  08/08/2024 [facility] NEW START [facility] LINAC 3        ==========APPENDIX - ON TREATMENT VISIT NOTES==========   See weekly On Treatment Notes in Epic for details in the Media tab (listed as Progress notes on the On Treatment Visit Dates listed above).

## 2024-08-09 ENCOUNTER — Other Ambulatory Visit: Payer: Self-pay

## 2024-08-09 ENCOUNTER — Ambulatory Visit
Admission: RE | Admit: 2024-08-09 | Discharge: 2024-08-09 | Disposition: A | Discharge status: Home / Self Care | Source: Ambulatory Visit | Attending: Radiation Oncology | Admitting: Radiation Oncology

## 2024-08-09 DIAGNOSIS — Z510 Encounter for antineoplastic radiation therapy: Secondary | ICD-10-CM | POA: Diagnosis not present

## 2024-08-09 DIAGNOSIS — C61 Malignant neoplasm of prostate: Secondary | ICD-10-CM | POA: Diagnosis not present

## 2024-08-09 LAB — RAD ONC ARIA SESSION SUMMARY
Course Elapsed Days: 1
Plan Fractions Treated to Date: 2
Plan Prescribed Dose Per Fraction: 1.8 Gy
Plan Total Fractions Prescribed: 25
Plan Total Prescribed Dose: 45 Gy
Reference Point Dosage Given to Date: 3.6 Gy
Reference Point Session Dosage Given: 1.8 Gy
Session Number: 2

## 2024-08-09 NOTE — Radiation Completion Notes (Signed)
 Patient Name: Joseph Cox, Joseph Cox MRN: [id number] Date of Birth: 1956-12-06 Referring Physician: RICARDO LIKENS, M.D. Date of Service: 2024-08-09 Radiation Oncologist: Adina Barge, M.D. [facility]                              RADIATION ONCOLOGY END OF TREATMENT NOTE     Diagnosis: C61 Malignant neoplasm of prostate Staging on 2024-01-16: Malignant neoplasm of prostate (HCC) T=cT1c, N=cN0, M=cM1b Intent: Curative     ==========DELIVERED PLANS==========  First Treatment Date: 2024-08-01 Last Treatment Date: 2024-08-09   Plan Name: Prostate_Pelv Site: Prostate Technique: IMRT Mode: Photon Dose Per Fraction: 1.8 Gy Prescribed Dose (Delivered / Prescribed): 3.6 Gy / 45 Gy Prescribed Fxs (Delivered / Prescribed): 2 / 25   Plan Name: Prostate Seed Implant Site: Prostate Technique: Radioactive Seed Implant I-125 Mode: Brachytherapy Dose Per Fraction: 110 Gy Prescribed Dose (Delivered / Prescribed): 110 Gy / 110 Gy Prescribed Fxs (Delivered / Prescribed): 1 / 1     ==========ON TREATMENT VISIT DATES==========      ==========UPCOMING VISITS========== 09/12/2024 [facility] FINAL TREATMENT [facility] LINAC 3  09/11/2024 [facility] IMRT TREATMENT [facility] LINAC 3  09/10/2024 [facility] IMRT TREATMENT [facility] LINAC 3  09/07/2024 [facility] IMRT TREATMENT [facility] LINAC 3  09/06/2024 [facility] IMRT TREATMENT [facility] LINAC 3  09/05/2024 [facility] IMRT TREATMENT [facility] LINAC 3  09/04/2024 [facility] IMRT TREATMENT [facility] LINAC 4  09/03/2024 [facility] IMRT TREATMENT [facility] LINAC 3  08/31/2024 [facility] ONC IMRT TREATMENT [facility] [id number]  08/30/2024 [facility] IMRT TREATMENT [facility] [id number]  08/29/2024 [facility] IMRT TREATMENT [facility] [id number]  08/28/2024 [facility] IMRT  TREATMENT [facility] LINAC 3  08/27/2024 [facility] IMRT TREATMENT [facility] LINAC 4  08/24/2024 [facility] ONC IMRT TREATMENT [facility] LINAC 4  08/23/2024 [facility] ONC IMRT TREATMENT [facility] LINAC 3  08/22/2024 [facility] ONC IMRT TREATMENT [facility] LINAC 3  08/21/2024 [facility] ONC IMRT TREATMENT [facility] LINAC 3  08/20/2024 [facility] ON[facility] IMRT TREATMENT [facility] LINAC 3  08/17/2024 [facility] ONC WEEKLY UT CHECK LINAC-MANNING  08/17/2024 [facility]C IMRT TREATMENT [facility] LINAC 3  08/16/2024 [facility] ONC IMRT TREATMENT [facility] [id number]  08/15/2024 [facility] ONC IMRT TREATMENT [facility] [id number]  08/14/2024 [facility] ON[facility] IMRT TREATMENT [facility] [id number]  08/10/2024 [facility]RAD[facility] ONC WEEKLY UT CHECK LINAC-MANNING  08/10/2024 [facility] ONC IMRT TREATMENT [facility] LINAC 4  08/09/2024 [facility] IMRT TREATMENT [facility] LINAC 3        ==========APPENDIX - ON TREATMENT VISIT NOTES==========   See weekly On Treatment Notes in Epic for details in the Media tab (listed as Progress notes on the On Treatment Visit Dates listed above).

## 2024-08-10 ENCOUNTER — Ambulatory Visit
Admission: RE | Admit: 2024-08-10 | Discharge: 2024-08-10 | Disposition: A | Discharge status: Home / Self Care | Source: Ambulatory Visit | Attending: Radiation Oncology | Admitting: Radiation Oncology

## 2024-08-10 ENCOUNTER — Other Ambulatory Visit: Payer: Self-pay

## 2024-08-10 DIAGNOSIS — C61 Malignant neoplasm of prostate: Secondary | ICD-10-CM | POA: Diagnosis not present

## 2024-08-10 DIAGNOSIS — Z510 Encounter for antineoplastic radiation therapy: Secondary | ICD-10-CM | POA: Diagnosis not present

## 2024-08-10 LAB — RAD ONC ARIA SESSION SUMMARY
Course Elapsed Days: 2
Plan Fractions Treated to Date: 3
Plan Prescribed Dose Per Fraction: 1.8 Gy
Plan Total Fractions Prescribed: 25
Plan Total Prescribed Dose: 45 Gy
Reference Point Dosage Given to Date: 5.4 Gy
Reference Point Session Dosage Given: 1.8 Gy
Session Number: 3

## 2024-08-10 NOTE — Radiation Completion Notes (Signed)
 Patient Name: [name], Joseph Cox MRN: [id number] Date of Birth: 07-10-1956 Referring Physician: RICARDO LIKENS, M.D. Date of Service: 2024-08-10 Radiation Oncologist: Adina Barge, M.D. [facility] [place]                             RADIATION ONCOLOGY END OF TREATMENT NOTE     Diagnosis: C61 Malignant neoplasm of prostate Staging on 2024-01-16: Malignant neoplasm of prostate (HCC) T=cT1c, N=cN0, M=cM1b Intent: Curative     ==========DELIVERED PLANS==========  First Treatment Date: 2024-08-01 Last Treatment Date: 2024-08-10   Plan Name: Prostate_Pelv Site: Prostate Technique: IMRT Mode: Photon Dose Per Fraction: 1.8 Gy Prescribed Dose (Delivered / Prescribed): 5.4 Gy / 45 Gy Prescribed Fxs (Delivered / Prescribed): 3 / 25   Plan Name: Prostate Seed Implant Site: Prostate Technique: Radioactive Seed Implant I-125 Mode: Brachytherapy Dose Per Fraction: 110 Gy Prescribed Dose (Delivered / Prescribed): 110 Gy / 110 Gy Prescribed Fxs (Delivered / Prescribed): 1 / 1     ==========ON TREATMENT VISIT DATES========== 2024-08-10     ==========UPCOMING VISITS========== 09/12/2024 [facility] ONC FINAL TREATMENT [facility] LINAC 3  09/11/2024 [facility]C IMRT TREATMENT [facility] LINAC 3  09/10/2024 [facility] ONC IMRT TREATMENT [facility] LINAC 3  09/07/2024 [facility] ONC IMRT TREATMENT [facility] LINAC 3  09/06/2024 [facility] ONC IMRT TREATMENT [facility] LINAC 3  09/05/2024 [facility] ONC IMRT TREATMENT [facility] LINAC 3  09/04/2024 [facility] ONC IMRT TREATMENT [facility] LINAC 4  09/03/2024 [facility] ONC IMRT TREATMENT [facility] LINAC 3  08/31/2024 [facility] ONC IMRT TREATMENT [facility] [id number]  08/30/2024 [facility] ONC IMRT TREATMENT [facility] [id number]  08/29/2024 [facility] ONC IMRT TREATMENT [facility] [id number]  08/28/2024 [facility] ONC IMRT  TREATMENT [facility] LINAC 3  08/27/2024 [facility] ONC IMRT TREATMENT [facility] LINAC 4  08/24/2024 [facility] ONC IMRT TREATMENT [facility] LINAC 4  08/23/2024 [facility] ONC IMRT TREATMENT [facility] LINAC 3  08/22/2024 [facility] ONC IMRT TREATMENT [facility] LINAC 3  08/21/2024 [facility] ONC IMRT TREATMENT [facility] LINAC 3  08/20/2024 [facility]RADI[facility] ONC IMRT TREATMENT [facility] LINAC 3  08/17/2024 [facility]RADIATION ONC WEEKLY UT CHECK LINAC-MANNING  08/17/2024 [facility] ONC IMRT TREATMENT [facility] LINAC 3  08/16/2024 [facility] ONC IMRT TREATMENT [facility] [id number]  08/15/2024 [facility] ONC IMRT TREATMENT [facility] [id number]  08/14/2024 [facility] ONC IMRT TREATMENT [facility] [id number]  08/10/2024 [facility]RADIATION ONC WEEKLY UT CHECK LINAC-MANNING  08/10/2024 [facility]RAD[facility] ONC IMRT TREATMENT [facility] LINAC 4        ==========APPENDIX - ON TREATMENT VISIT NOTES==========   See weekly On Treatment Notes in Epic for details in the Media tab (listed as Progress notes on the On Treatment Visit Dates listed above).

## 2024-08-14 ENCOUNTER — Other Ambulatory Visit: Payer: Self-pay

## 2024-08-14 ENCOUNTER — Ambulatory Visit
Admission: RE | Admit: 2024-08-14 | Discharge: 2024-08-14 | Disposition: A | Discharge status: Home / Self Care | Source: Ambulatory Visit | Attending: Radiation Oncology | Admitting: Radiation Oncology

## 2024-08-14 DIAGNOSIS — R300 Dysuria: Secondary | ICD-10-CM | POA: Diagnosis not present

## 2024-08-14 DIAGNOSIS — C61 Malignant neoplasm of prostate: Secondary | ICD-10-CM | POA: Insufficient documentation

## 2024-08-14 DIAGNOSIS — Z510 Encounter for antineoplastic radiation therapy: Secondary | ICD-10-CM | POA: Diagnosis not present

## 2024-08-14 LAB — RAD ONC ARIA SESSION SUMMARY
Course Elapsed Days: 6
Plan Fractions Treated to Date: 4
Plan Prescribed Dose Per Fraction: 1.8 Gy
Plan Total Fractions Prescribed: 25
Plan Total Prescribed Dose: 45 Gy
Reference Point Dosage Given to Date: 7.2 Gy
Reference Point Session Dosage Given: 1.8 Gy
Session Number: 4

## 2024-08-14 NOTE — Radiation Completion Notes (Signed)
 Patient Name: Joseph Cox, Joseph Cox MRN: [id number] Date of Birth: [birth year] Referring Physician: RICARDO LIKENS, M.D. Date of Service: 2024-08-14 Radiation Oncologist: Adina Barge, M.D. [facility] [place]                             RADIATION ONCOLOGY END OF TREATMENT NOTE     Diagnosis: C61 Malignant neoplasm of prostate Staging on 2024-01-16: Malignant neoplasm of prostate (HCC) T=cT1c, N=cN0, M=cM1b Intent: Curative     ==========DELIVERED PLANS==========  First Treatment Date: 2024-08-01 Last Treatment Date: 2024-08-14   Plan Name: Prostate_Pelv Site: Prostate Technique: IMRT Mode: Photon Dose Per Fraction: 1.8 Gy Prescribed Dose (Delivered / Prescribed): 7.2 Gy / 45 Gy Prescribed Fxs (Delivered / Prescribed): 4 / 25   Plan Name: Prostate Seed Implant Site: Prostate Technique: Radioactive Seed Implant I-125 Mode: Brachytherapy Dose Per Fraction: 110 Gy Prescribed Dose (Delivered / Prescribed): 110 Gy / 110 Gy Prescribed Fxs (Delivered / Prescribed): 1 / 1     ==========ON TREATMENT VISIT DATES========== 2024-08-10     ==========UPCOMING VISITS========== 09/12/2024 [facility]RADIATION ONC FINAL TREATMENT [facility] LINAC 3  09/11/2024 [facility] ONC IMRT TREATMENT [facility] LINAC 3  09/10/2024 [facility] ONC IMRT TREATMENT [facility] LINAC 3  09/07/2024 [facility]ATION ONC IMRT TREATMENT [facility] LINAC 3  09/06/2024 [facility] ONC IMRT TREATMENT [facility] LINAC 3  09/05/2024 [facility]RADI[facility] ONC IMRT TREATMENT [facility] LINAC 3  09/04/2024 [facility] ONC IMRT TREATMENT [facility] LINAC 4  09/03/2024 [facility]RADIATION ONC IMRT TREATMENT [facility] LINAC 3  08/31/2024 [facility]RADIATION ONC IMRT TREATMENT [facility] [id number]  08/30/2024 [facility]RAD[facility] ONC IMRT TREATMENT [facility] [id number]  08/29/2024 [facility]RADIATION ONC IMRT TREATMENT [facility] [id number]  08/28/2024 [facility]RADIATION ONC IMRT  TREATMENT [facility] LINAC 3  08/27/2024 [facility]RADI[facility] ONC IMRT TREATMENT [facility] LINAC 4  08/24/2024 [facility] ONC IMRT TREATMENT [facility] LINAC 4  08/23/2024 [facility] ONC IMRT TREATMENT [facility] LINAC 3  08/22/2024 [facility]RADIATION ONC IMRT TREATMENT [facility] LINAC 3  08/21/2024 [facility]RADIATION ONC IMRT TREATMENT [facility] LINAC 3  08/20/2024 [facility]RADI[facility] ONC IMRT TREATMENT [facility] LINAC 3  08/17/2024 [facility]-RADIATION ONC WEEKLY UT CHECK LINAC-MANNING  08/17/2024 [facility]RADIATION ONC IMRT TREATMENT [facility] LINAC 3  08/16/2024 [facility]RADIATION ONC IMRT TREATMENT [facility] [id number]  08/15/2024 [facility]RADI[facility] ONC IMRT TREATMENT [facility] [id number]  08/14/2024 [facility]RADI[facility] ONC IMRT TREATMENT [facility] [id number]        ==========APPENDIX - ON TREATMENT VISIT NOTES==========   See weekly On Treatment Notes in Epic for details in the Media tab (listed as Progress notes on the On Treatment Visit Dates listed above).

## 2024-08-15 ENCOUNTER — Other Ambulatory Visit: Payer: Self-pay

## 2024-08-15 ENCOUNTER — Ambulatory Visit
Admission: RE | Admit: 2024-08-15 | Discharge: 2024-08-15 | Disposition: A | Discharge status: Home / Self Care | Source: Ambulatory Visit | Attending: Radiation Oncology

## 2024-08-15 DIAGNOSIS — Z510 Encounter for antineoplastic radiation therapy: Secondary | ICD-10-CM | POA: Diagnosis not present

## 2024-08-15 DIAGNOSIS — Z191 Hormone sensitive malignancy status: Secondary | ICD-10-CM | POA: Diagnosis not present

## 2024-08-15 DIAGNOSIS — C61 Malignant neoplasm of prostate: Secondary | ICD-10-CM | POA: Diagnosis not present

## 2024-08-15 DIAGNOSIS — R300 Dysuria: Secondary | ICD-10-CM | POA: Diagnosis not present

## 2024-08-15 LAB — RAD ONC ARIA SESSION SUMMARY
Course Elapsed Days: 7
Plan Fractions Treated to Date: 5
Plan Prescribed Dose Per Fraction: 1.8 Gy
Plan Total Fractions Prescribed: 25
Plan Total Prescribed Dose: 45 Gy
Reference Point Dosage Given to Date: 9 Gy
Reference Point Session Dosage Given: 1.8 Gy
Session Number: 5

## 2024-08-15 NOTE — Radiation Completion Notes (Signed)
 Patient Name: Joseph Cox, Joseph Cox MRN: [id number] Date of Birth: August 24, 1956 Referring Physician: RICARDO LIKENS, M.D. Date of Service: 2024-08-15 Radiation Oncologist: Adina Barge, M.D. [facility] [place]                             RADIATION ONCOLOGY END OF TREATMENT NOTE     Diagnosis: C61 Malignant neoplasm of prostate Staging on 2024-01-16: Malignant neoplasm of prostate (HCC) T=cT1c, N=cN0, M=cM1b Intent: Curative     ==========DELIVERED PLANS==========  First Treatment Date: 2024-08-01 Last Treatment Date: 2024-08-15   Plan Name: Prostate_Pelv Site: Prostate Technique: IMRT Mode: Photon Dose Per Fraction: 1.8 Gy Prescribed Dose (Delivered / Prescribed): 9 Gy / 45 Gy Prescribed Fxs (Delivered / Prescribed): 5 / 25   Plan Name: Prostate Seed Implant Site: Prostate Technique: Radioactive Seed Implant I-125 Mode: Brachytherapy Dose Per Fraction: 110 Gy Prescribed Dose (Delivered / Prescribed): 110 Gy / 110 Gy Prescribed Fxs (Delivered / Prescribed): 1 / 1     ==========ON TREATMENT VISIT DATES========== 2024-08-10     ==========UPCOMING VISITS========== 09/12/2024 [facility]RAD[facility] ONC FINAL TREATMENT [facility] LINAC 3  09/11/2024 [facility] ONC IMRT TREATMENT [facility] LINAC 3  09/10/2024 [facility] ONC IMRT TREATMENT [facility] LINAC 3  09/07/2024 [facility] ONC IMRT TREATMENT [facility] LINAC 3  09/06/2024 [facility] ONC IMRT TREATMENT [facility] LINAC 3  09/05/2024 [facility]RADI[facility] ONC IMRT TREATMENT [facility] LINAC 3  09/04/2024 [facility]RADI[facility] ONC IMRT TREATMENT [facility] LINAC 4  09/03/2024 [facility]RADIATION ONC IMRT TREATMENT [facility] LINAC 3  08/31/2024 [facility]-RADIATION ONC WEEKLY UT CHECK LINAC-MANNING  08/31/2024 [facility]RADIATION ONC IMRT TREATMENT [facility] [id number]  08/30/2024 [facility]RADIATION ONC IMRT TREATMENT [facility] [id number]  08/29/2024 [facility]RADIATION ONC IMRT  TREATMENT [facility] [id number]  08/28/2024 [facility]RADIATION ONC IMRT TREATMENT [facility] LINAC 3  08/27/2024 [facility]RADIATION ONC IMRT TREATMENT [facility] LINAC 4  08/24/2024 [facility]-RADIATION ONC WEEKLY UT CHECK LINAC-MANNING  08/24/2024 [facility]RADIATION ONC IMRT TREATMENT [facility] LINAC 4  08/23/2024 [facility]RADIATION ONC IMRT TREATMENT [facility] LINAC 3  08/22/2024 [facility]RADIATION ONC IMRT TREATMENT [facility] LINAC 3  08/21/2024 [facility]RADIATION ONC IMRT TREATMENT [facility] LINAC 3  08/20/2024 [facility]RADIATION ONC IMRT TREATMENT [facility] LINAC 3  08/17/2024 [facility]-RADIATION ONC WEEKLY UT CHECK LINAC-MANNING  08/17/2024 [facility]RADIATION ONC IMRT TREATMENT [facility] LINAC 3  08/16/2024 [facility]RADIATION ONC IMRT TREATMENT [facility] [id number]  08/15/2024 [facility] ONC IMRT TREATMENT [facility] [id number]        ==========APPENDIX - ON TREATMENT VISIT NOTES==========   See weekly On Treatment Notes in Epic for details in the Media tab (listed as Progress notes on the On Treatment Visit Dates listed above).

## 2024-08-16 ENCOUNTER — Ambulatory Visit
Admission: RE | Admit: 2024-08-16 | Discharge: 2024-08-16 | Disposition: A | Discharge status: Home / Self Care | Source: Ambulatory Visit | Attending: Radiation Oncology | Admitting: Radiation Oncology

## 2024-08-16 ENCOUNTER — Other Ambulatory Visit: Payer: Self-pay

## 2024-08-16 DIAGNOSIS — Z510 Encounter for antineoplastic radiation therapy: Secondary | ICD-10-CM | POA: Diagnosis not present

## 2024-08-16 LAB — RAD ONC ARIA SESSION SUMMARY
Course Elapsed Days: 8
Plan Fractions Treated to Date: 6
Plan Prescribed Dose Per Fraction: 1.8 Gy
Plan Total Fractions Prescribed: 25
Plan Total Prescribed Dose: 45 Gy
Reference Point Dosage Given to Date: 10.8 Gy
Reference Point Session Dosage Given: 1.8 Gy
Session Number: 6

## 2024-08-17 ENCOUNTER — Ambulatory Visit
Admission: RE | Admit: 2024-08-17 | Discharge: 2024-08-17 | Disposition: A | Discharge status: Home / Self Care | Source: Ambulatory Visit | Attending: Radiation Oncology | Admitting: Radiation Oncology

## 2024-08-17 ENCOUNTER — Other Ambulatory Visit: Payer: Self-pay

## 2024-08-17 DIAGNOSIS — Z510 Encounter for antineoplastic radiation therapy: Secondary | ICD-10-CM | POA: Diagnosis not present

## 2024-08-17 LAB — RAD ONC ARIA SESSION SUMMARY
Course Elapsed Days: 9
Plan Fractions Treated to Date: 7
Plan Prescribed Dose Per Fraction: 1.8 Gy
Plan Total Fractions Prescribed: 25
Plan Total Prescribed Dose: 45 Gy
Reference Point Dosage Given to Date: 12.6 Gy
Reference Point Session Dosage Given: 1.8 Gy
Session Number: 7

## 2024-08-20 ENCOUNTER — Ambulatory Visit
Admission: RE | Admit: 2024-08-20 | Discharge: 2024-08-20 | Disposition: A | Discharge status: Home / Self Care | Source: Ambulatory Visit | Attending: Radiation Oncology | Admitting: Radiation Oncology

## 2024-08-20 ENCOUNTER — Other Ambulatory Visit: Payer: Self-pay

## 2024-08-20 DIAGNOSIS — Z510 Encounter for antineoplastic radiation therapy: Secondary | ICD-10-CM | POA: Diagnosis not present

## 2024-08-20 DIAGNOSIS — C61 Malignant neoplasm of prostate: Secondary | ICD-10-CM | POA: Diagnosis not present

## 2024-08-20 DIAGNOSIS — R300 Dysuria: Secondary | ICD-10-CM | POA: Diagnosis not present

## 2024-08-20 LAB — RAD ONC ARIA SESSION SUMMARY
Course Elapsed Days: 12
Plan Fractions Treated to Date: 8
Plan Prescribed Dose Per Fraction: 1.8 Gy
Plan Total Fractions Prescribed: 25
Plan Total Prescribed Dose: 45 Gy
Reference Point Dosage Given to Date: 14.4 Gy
Reference Point Session Dosage Given: 1.8 Gy
Session Number: 8

## 2024-08-21 ENCOUNTER — Ambulatory Visit
Admission: RE | Admit: 2024-08-21 | Discharge: 2024-08-21 | Disposition: A | Discharge status: Home / Self Care | Source: Ambulatory Visit | Attending: Radiation Oncology

## 2024-08-21 ENCOUNTER — Other Ambulatory Visit: Payer: Self-pay

## 2024-08-21 DIAGNOSIS — Z510 Encounter for antineoplastic radiation therapy: Secondary | ICD-10-CM | POA: Diagnosis not present

## 2024-08-21 DIAGNOSIS — R300 Dysuria: Secondary | ICD-10-CM | POA: Diagnosis not present

## 2024-08-21 DIAGNOSIS — C61 Malignant neoplasm of prostate: Secondary | ICD-10-CM | POA: Diagnosis not present

## 2024-08-21 LAB — RAD ONC ARIA SESSION SUMMARY
Course Elapsed Days: 13
Plan Fractions Treated to Date: 9
Plan Prescribed Dose Per Fraction: 1.8 Gy
Plan Total Fractions Prescribed: 25
Plan Total Prescribed Dose: 45 Gy
Reference Point Dosage Given to Date: 16.2 Gy
Reference Point Session Dosage Given: 1.8 Gy
Session Number: 9

## 2024-08-22 ENCOUNTER — Other Ambulatory Visit: Payer: Self-pay

## 2024-08-22 ENCOUNTER — Ambulatory Visit
Admission: RE | Admit: 2024-08-22 | Discharge: 2024-08-22 | Disposition: A | Discharge status: Home / Self Care | Source: Ambulatory Visit | Attending: Radiation Oncology | Admitting: Radiation Oncology

## 2024-08-22 DIAGNOSIS — Z191 Hormone sensitive malignancy status: Secondary | ICD-10-CM | POA: Diagnosis not present

## 2024-08-22 DIAGNOSIS — Z510 Encounter for antineoplastic radiation therapy: Secondary | ICD-10-CM | POA: Diagnosis not present

## 2024-08-22 DIAGNOSIS — C61 Malignant neoplasm of prostate: Secondary | ICD-10-CM | POA: Diagnosis not present

## 2024-08-22 LAB — RAD ONC ARIA SESSION SUMMARY
Course Elapsed Days: 14
Plan Fractions Treated to Date: 10
Plan Prescribed Dose Per Fraction: 1.8 Gy
Plan Total Fractions Prescribed: 25
Plan Total Prescribed Dose: 45 Gy
Reference Point Dosage Given to Date: 18 Gy
Reference Point Session Dosage Given: 1.8 Gy
Session Number: 10

## 2024-08-23 ENCOUNTER — Ambulatory Visit
Admission: RE | Admit: 2024-08-23 | Discharge: 2024-08-23 | Disposition: A | Discharge status: Home / Self Care | Source: Ambulatory Visit | Attending: Radiation Oncology

## 2024-08-23 ENCOUNTER — Other Ambulatory Visit: Payer: Self-pay

## 2024-08-23 ENCOUNTER — Ambulatory Visit
Admission: RE | Admit: 2024-08-23 | Discharge: 2024-08-23 | Discharge status: Home / Self Care | Source: Ambulatory Visit | Attending: Radiation Oncology

## 2024-08-23 DIAGNOSIS — Z510 Encounter for antineoplastic radiation therapy: Secondary | ICD-10-CM | POA: Diagnosis not present

## 2024-08-23 LAB — RAD ONC ARIA SESSION SUMMARY
Course Elapsed Days: 15
Plan Fractions Treated to Date: 11
Plan Prescribed Dose Per Fraction: 1.8 Gy
Plan Total Fractions Prescribed: 25
Plan Total Prescribed Dose: 45 Gy
Reference Point Dosage Given to Date: 19.8 Gy
Reference Point Session Dosage Given: 1.8 Gy
Session Number: 11

## 2024-08-24 ENCOUNTER — Other Ambulatory Visit: Payer: Self-pay

## 2024-08-24 ENCOUNTER — Ambulatory Visit

## 2024-08-24 ENCOUNTER — Ambulatory Visit
Admission: RE | Admit: 2024-08-24 | Discharge: 2024-08-24 | Disposition: A | Discharge status: Home / Self Care | Source: Ambulatory Visit | Attending: Radiation Oncology | Admitting: Radiation Oncology

## 2024-08-24 DIAGNOSIS — Z510 Encounter for antineoplastic radiation therapy: Secondary | ICD-10-CM | POA: Diagnosis not present

## 2024-08-24 DIAGNOSIS — R300 Dysuria: Secondary | ICD-10-CM | POA: Diagnosis not present

## 2024-08-24 DIAGNOSIS — C61 Malignant neoplasm of prostate: Secondary | ICD-10-CM | POA: Diagnosis not present

## 2024-08-24 LAB — RAD ONC ARIA SESSION SUMMARY
Course Elapsed Days: 16
Plan Fractions Treated to Date: 12
Plan Prescribed Dose Per Fraction: 1.8 Gy
Plan Total Fractions Prescribed: 25
Plan Total Prescribed Dose: 45 Gy
Reference Point Dosage Given to Date: 21.6 Gy
Reference Point Session Dosage Given: 1.8 Gy
Session Number: 12

## 2024-08-27 ENCOUNTER — Ambulatory Visit
Admission: RE | Admit: 2024-08-27 | Discharge: 2024-08-27 | Disposition: A | Discharge status: Home / Self Care | Source: Ambulatory Visit | Attending: Radiation Oncology | Admitting: Radiation Oncology

## 2024-08-27 ENCOUNTER — Other Ambulatory Visit: Payer: Self-pay

## 2024-08-27 DIAGNOSIS — Z510 Encounter for antineoplastic radiation therapy: Secondary | ICD-10-CM | POA: Diagnosis not present

## 2024-08-27 LAB — RAD ONC ARIA SESSION SUMMARY
Course Elapsed Days: 19
Plan Fractions Treated to Date: 13
Plan Prescribed Dose Per Fraction: 1.8 Gy
Plan Total Fractions Prescribed: 25
Plan Total Prescribed Dose: 45 Gy
Reference Point Dosage Given to Date: 23.4 Gy
Reference Point Session Dosage Given: 1.8 Gy
Session Number: 13

## 2024-08-28 ENCOUNTER — Ambulatory Visit
Admission: RE | Admit: 2024-08-28 | Discharge: 2024-08-28 | Disposition: A | Discharge status: Home / Self Care | Source: Ambulatory Visit | Attending: Radiation Oncology | Admitting: Radiation Oncology

## 2024-08-28 ENCOUNTER — Other Ambulatory Visit: Payer: Self-pay

## 2024-08-28 DIAGNOSIS — Z510 Encounter for antineoplastic radiation therapy: Secondary | ICD-10-CM | POA: Diagnosis not present

## 2024-08-28 LAB — RAD ONC ARIA SESSION SUMMARY
Course Elapsed Days: 20
Plan Fractions Treated to Date: 14
Plan Prescribed Dose Per Fraction: 1.8 Gy
Plan Total Fractions Prescribed: 25
Plan Total Prescribed Dose: 45 Gy
Reference Point Dosage Given to Date: 25.2 Gy
Reference Point Session Dosage Given: 1.8 Gy
Session Number: 14

## 2024-08-29 ENCOUNTER — Other Ambulatory Visit: Payer: Self-pay

## 2024-08-29 ENCOUNTER — Ambulatory Visit
Admission: RE | Admit: 2024-08-29 | Discharge: 2024-08-29 | Disposition: A | Discharge status: Home / Self Care | Source: Ambulatory Visit | Attending: Radiation Oncology

## 2024-08-29 DIAGNOSIS — Z510 Encounter for antineoplastic radiation therapy: Secondary | ICD-10-CM | POA: Diagnosis not present

## 2024-08-29 LAB — RAD ONC ARIA SESSION SUMMARY
Course Elapsed Days: 21
Plan Fractions Treated to Date: 15
Plan Prescribed Dose Per Fraction: 1.8 Gy
Plan Total Fractions Prescribed: 25
Plan Total Prescribed Dose: 45 Gy
Reference Point Dosage Given to Date: 27 Gy
Reference Point Session Dosage Given: 1.8 Gy
Session Number: 15

## 2024-08-30 ENCOUNTER — Other Ambulatory Visit: Payer: Self-pay

## 2024-08-30 ENCOUNTER — Ambulatory Visit
Admission: RE | Admit: 2024-08-30 | Discharge: 2024-08-30 | Disposition: A | Discharge status: Home / Self Care | Source: Ambulatory Visit | Attending: Radiation Oncology

## 2024-08-30 DIAGNOSIS — Z510 Encounter for antineoplastic radiation therapy: Secondary | ICD-10-CM | POA: Diagnosis not present

## 2024-08-30 DIAGNOSIS — R300 Dysuria: Secondary | ICD-10-CM | POA: Diagnosis not present

## 2024-08-30 DIAGNOSIS — C61 Malignant neoplasm of prostate: Secondary | ICD-10-CM | POA: Diagnosis not present

## 2024-08-30 LAB — RAD ONC ARIA SESSION SUMMARY
Course Elapsed Days: 22
Plan Fractions Treated to Date: 16
Plan Prescribed Dose Per Fraction: 1.8 Gy
Plan Total Fractions Prescribed: 25
Plan Total Prescribed Dose: 45 Gy
Reference Point Dosage Given to Date: 28.8 Gy
Reference Point Session Dosage Given: 1.8 Gy
Session Number: 16

## 2024-08-31 ENCOUNTER — Other Ambulatory Visit: Payer: Self-pay

## 2024-08-31 ENCOUNTER — Ambulatory Visit
Admission: RE | Admit: 2024-08-31 | Discharge: 2024-08-31 | Disposition: A | Discharge status: Home / Self Care | Source: Ambulatory Visit | Attending: Radiation Oncology | Admitting: Radiation Oncology

## 2024-08-31 DIAGNOSIS — R300 Dysuria: Secondary | ICD-10-CM | POA: Diagnosis not present

## 2024-08-31 DIAGNOSIS — Z510 Encounter for antineoplastic radiation therapy: Secondary | ICD-10-CM | POA: Diagnosis not present

## 2024-08-31 DIAGNOSIS — C61 Malignant neoplasm of prostate: Secondary | ICD-10-CM | POA: Diagnosis not present

## 2024-08-31 LAB — RAD ONC ARIA SESSION SUMMARY
Course Elapsed Days: 23
Plan Fractions Treated to Date: 17
Plan Prescribed Dose Per Fraction: 1.8 Gy
Plan Total Fractions Prescribed: 25
Plan Total Prescribed Dose: 45 Gy
Reference Point Dosage Given to Date: 30.6 Gy
Reference Point Session Dosage Given: 1.8 Gy
Session Number: 17

## 2024-09-03 ENCOUNTER — Ambulatory Visit
Admission: RE | Admit: 2024-09-03 | Discharge: 2024-09-03 | Disposition: A | Discharge status: Home / Self Care | Source: Ambulatory Visit | Attending: Radiation Oncology

## 2024-09-03 ENCOUNTER — Other Ambulatory Visit: Payer: Self-pay

## 2024-09-03 DIAGNOSIS — Z510 Encounter for antineoplastic radiation therapy: Secondary | ICD-10-CM | POA: Diagnosis not present

## 2024-09-03 LAB — RAD ONC ARIA SESSION SUMMARY
Course Elapsed Days: 26
Plan Fractions Treated to Date: 18
Plan Prescribed Dose Per Fraction: 1.8 Gy
Plan Total Fractions Prescribed: 25
Plan Total Prescribed Dose: 45 Gy
Reference Point Dosage Given to Date: 32.4 Gy
Reference Point Session Dosage Given: 1.8 Gy
Session Number: 18

## 2024-09-04 ENCOUNTER — Ambulatory Visit
Admission: RE | Admit: 2024-09-04 | Discharge: 2024-09-04 | Disposition: A | Discharge status: Home / Self Care | Source: Ambulatory Visit | Attending: Radiation Oncology | Admitting: Radiation Oncology

## 2024-09-04 ENCOUNTER — Other Ambulatory Visit: Payer: Self-pay | Admitting: Radiation Oncology

## 2024-09-04 ENCOUNTER — Telehealth: Payer: Self-pay

## 2024-09-04 ENCOUNTER — Other Ambulatory Visit: Payer: Self-pay

## 2024-09-04 DIAGNOSIS — R310 Gross hematuria: Secondary | ICD-10-CM

## 2024-09-04 DIAGNOSIS — Z510 Encounter for antineoplastic radiation therapy: Secondary | ICD-10-CM | POA: Diagnosis not present

## 2024-09-04 LAB — RAD ONC ARIA SESSION SUMMARY
Course Elapsed Days: 27
Plan Fractions Treated to Date: 19
Plan Prescribed Dose Per Fraction: 1.8 Gy
Plan Total Fractions Prescribed: 25
Plan Total Prescribed Dose: 45 Gy
Reference Point Dosage Given to Date: 34.2 Gy
Reference Point Session Dosage Given: 1.8 Gy
Session Number: 19

## 2024-09-04 LAB — URINALYSIS, COMPLETE (UACMP) WITH MICROSCOPIC
Bacteria, UA: NONE SEEN
Bilirubin Urine: NEGATIVE
Glucose, UA: NEGATIVE mg/dL
Ketones, ur: NEGATIVE mg/dL
Leukocytes,Ua: NEGATIVE
Nitrite: NEGATIVE
Protein, ur: 30 mg/dL — AB
RBC / HPF: >50 RBC/hpf (ref 0–5)
Specific Gravity, Urine: 1.025 (ref 1.005–1.030)
pH: 5 (ref 5.0–8.0)

## 2024-09-04 MED ORDER — CIPROFLOXACIN HCL 500 MG PO TABS: 500.0000 mg | ORAL_TABLET | Freq: Two times a day (BID) | ORAL | 0 refills | Status: AC

## 2024-09-04 NOTE — Telephone Encounter (Addendum)
 RN called patient back spoke with spouse informed her that Dr. Patrcia has sent in antibiotic prescription take as directed 1 tablet po BID for 7 days, drink plenty of water, and to call us  back Joseph Cox was given my direct number to call if symptoms should worsen or develop any new issues.  Mrs. Chamberlin was appreciative of the call and expressed she will monitor call back if anything changes.

## 2024-09-04 NOTE — Progress Notes (Signed)
 Mr. Joseph Cox 68 y.o. 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  He's completed 18/25 planned treatment thus far.  Mr. Joseph Cox came in before treatment this morning had complaints of nocturia x 12, urinary frequency/urgency, dysuria, hematuria, and fatigue from running to bathroom.  Mr. Joseph Cox reports his penis is swollen.  Mr. Joseph Cox denies fever or chills.  Reports he's able to void that it hurts so he feels he's not retaining any urine.  Reports has been taking Tylenol  for pain non-effective.  RN had him come in little early to give us  a urine sample to test.  RN explained that we will here back today for UA, but that the urine culture may take couple days he is scheduled to see Dr. Patrcia for undertreat appointment on Thursday 09/06/2024.  He went on to have treatment this morning, and said he can pick up prescription on his way home.  He wants to know if anything can prescribe anything now for the discomfort, not sure if you want to start a preventive antibiotic or not while we wait for results.  He said to send script to [facility] on [address]

## 2024-09-04 NOTE — Telephone Encounter (Signed)
 RN called patient back per [employer], PA-C about his urinalysis results no obvious infection but we are awaiting urine culture results, so he should complete the course of Cipro  as prescribed unless notified otherwise. He can also use OTC AZO to help with the dysuria. 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  Advised to call if symptoms worsen or have fever, chills should go to ED right away.

## 2024-09-05 ENCOUNTER — Telehealth: Payer: Self-pay

## 2024-09-05 ENCOUNTER — Ambulatory Visit

## 2024-09-05 LAB — URINE CULTURE: Culture: NO GROWTH

## 2024-09-05 NOTE — Telephone Encounter (Signed)
 RN called patient back to give results of urine culture per Sabra Rusk, PA-C.  Mrs. Drost answered and put speaker on phone so that both she and Mr. Thumm could hear it's negative that this is not a bladder infection but could possibly be a prostate infection so as long as he is tolerating the Cirpo, it's recommend he complete as prescribed and ok to continue using AZO as needed, along with the Tamsulosin  (flomax ).  Mrs. Rorie reports he's only having scant amount of blood in urine now and that his pain has subsided with him taking the Tylenol , Cipro , and Tamsulosin .  He wants to wait to try the AZO later.  She verbalized understanding and will continue to monitor.  RN advised she call if need.

## 2024-09-06 ENCOUNTER — Other Ambulatory Visit: Payer: Self-pay

## 2024-09-06 ENCOUNTER — Ambulatory Visit

## 2024-09-06 ENCOUNTER — Ambulatory Visit
Admission: RE | Admit: 2024-09-06 | Discharge: 2024-09-06 | Disposition: A | Discharge status: Home / Self Care | Source: Ambulatory Visit | Attending: Radiation Oncology | Admitting: Radiation Oncology

## 2024-09-06 DIAGNOSIS — Z510 Encounter for antineoplastic radiation therapy: Secondary | ICD-10-CM | POA: Diagnosis not present

## 2024-09-06 LAB — RAD ONC ARIA SESSION SUMMARY
Course Elapsed Days: 29
Plan Fractions Treated to Date: 20
Plan Prescribed Dose Per Fraction: 1.8 Gy
Plan Total Fractions Prescribed: 25
Plan Total Prescribed Dose: 45 Gy
Reference Point Dosage Given to Date: 36 Gy
Reference Point Session Dosage Given: 1.8 Gy
Session Number: 20

## 2024-09-07 ENCOUNTER — Ambulatory Visit
Admission: RE | Admit: 2024-09-07 | Discharge: 2024-09-07 | Disposition: A | Discharge status: Home / Self Care | Source: Ambulatory Visit | Attending: Radiation Oncology | Admitting: Radiation Oncology

## 2024-09-07 ENCOUNTER — Other Ambulatory Visit: Payer: Self-pay

## 2024-09-07 DIAGNOSIS — C61 Malignant neoplasm of prostate: Secondary | ICD-10-CM | POA: Diagnosis not present

## 2024-09-07 DIAGNOSIS — Z510 Encounter for antineoplastic radiation therapy: Secondary | ICD-10-CM | POA: Diagnosis not present

## 2024-09-07 DIAGNOSIS — R300 Dysuria: Secondary | ICD-10-CM | POA: Diagnosis not present

## 2024-09-07 LAB — RAD ONC ARIA SESSION SUMMARY
Course Elapsed Days: 30
Plan Fractions Treated to Date: 21
Plan Prescribed Dose Per Fraction: 1.8 Gy
Plan Total Fractions Prescribed: 25
Plan Total Prescribed Dose: 45 Gy
Reference Point Dosage Given to Date: 37.8 Gy
Reference Point Session Dosage Given: 1.8 Gy
Session Number: 21

## 2024-09-10 ENCOUNTER — Other Ambulatory Visit: Payer: Self-pay

## 2024-09-10 ENCOUNTER — Ambulatory Visit
Admission: RE | Admit: 2024-09-10 | Discharge: 2024-09-10 | Disposition: A | Discharge status: Home / Self Care | Source: Ambulatory Visit | Attending: Radiation Oncology | Admitting: Radiation Oncology

## 2024-09-10 DIAGNOSIS — Z510 Encounter for antineoplastic radiation therapy: Secondary | ICD-10-CM | POA: Diagnosis not present

## 2024-09-10 LAB — RAD ONC ARIA SESSION SUMMARY
Course Elapsed Days: 33
Plan Fractions Treated to Date: 22
Plan Prescribed Dose Per Fraction: 1.8 Gy
Plan Total Fractions Prescribed: 25
Plan Total Prescribed Dose: 45 Gy
Reference Point Dosage Given to Date: 39.6 Gy
Reference Point Session Dosage Given: 1.8 Gy
Session Number: 22

## 2024-09-11 ENCOUNTER — Ambulatory Visit
Admission: RE | Admit: 2024-09-11 | Discharge: 2024-09-11 | Disposition: A | Discharge status: Home / Self Care | Source: Ambulatory Visit | Attending: Radiation Oncology | Admitting: Radiation Oncology

## 2024-09-11 ENCOUNTER — Other Ambulatory Visit: Payer: Self-pay

## 2024-09-11 ENCOUNTER — Ambulatory Visit
Admission: RE | Admit: 2024-09-11 | Discharge: 2024-09-11 | Disposition: A | Source: Ambulatory Visit | Attending: Radiation Oncology

## 2024-09-11 DIAGNOSIS — Z510 Encounter for antineoplastic radiation therapy: Secondary | ICD-10-CM | POA: Diagnosis not present

## 2024-09-11 LAB — RAD ONC ARIA SESSION SUMMARY
Course Elapsed Days: 34
Plan Fractions Treated to Date: 23
Plan Prescribed Dose Per Fraction: 1.8 Gy
Plan Total Fractions Prescribed: 25
Plan Total Prescribed Dose: 45 Gy
Reference Point Dosage Given to Date: 41.4 Gy
Reference Point Session Dosage Given: 1.8 Gy
Session Number: 23

## 2024-09-12 ENCOUNTER — Ambulatory Visit
Admission: RE | Admit: 2024-09-12 | Discharge: 2024-09-12 | Disposition: A | Discharge status: Home / Self Care | Source: Ambulatory Visit | Attending: Radiation Oncology | Admitting: Radiation Oncology

## 2024-09-12 ENCOUNTER — Other Ambulatory Visit: Payer: Self-pay

## 2024-09-12 DIAGNOSIS — Z510 Encounter for antineoplastic radiation therapy: Secondary | ICD-10-CM | POA: Diagnosis not present

## 2024-09-12 DIAGNOSIS — C61 Malignant neoplasm of prostate: Secondary | ICD-10-CM | POA: Diagnosis not present

## 2024-09-12 DIAGNOSIS — R300 Dysuria: Secondary | ICD-10-CM | POA: Diagnosis not present

## 2024-09-12 LAB — RAD ONC ARIA SESSION SUMMARY
Course Elapsed Days: 35
Plan Fractions Treated to Date: 24
Plan Prescribed Dose Per Fraction: 1.8 Gy
Plan Total Fractions Prescribed: 25
Plan Total Prescribed Dose: 45 Gy
Reference Point Dosage Given to Date: 43.2 Gy
Reference Point Session Dosage Given: 1.8 Gy
Session Number: 24

## 2024-09-13 ENCOUNTER — Ambulatory Visit
Admission: RE | Admit: 2024-09-13 | Discharge: 2024-09-13 | Disposition: A | Source: Ambulatory Visit | Attending: Radiation Oncology

## 2024-09-13 ENCOUNTER — Other Ambulatory Visit: Payer: Self-pay

## 2024-09-13 DIAGNOSIS — C61 Malignant neoplasm of prostate: Secondary | ICD-10-CM | POA: Diagnosis not present

## 2024-09-13 DIAGNOSIS — Z191 Hormone sensitive malignancy status: Secondary | ICD-10-CM | POA: Diagnosis not present

## 2024-09-13 DIAGNOSIS — Z510 Encounter for antineoplastic radiation therapy: Secondary | ICD-10-CM | POA: Diagnosis not present

## 2024-09-13 LAB — RAD ONC ARIA SESSION SUMMARY
Course Elapsed Days: 36
Plan Fractions Treated to Date: 25
Plan Prescribed Dose Per Fraction: 1.8 Gy
Plan Total Fractions Prescribed: 25
Plan Total Prescribed Dose: 45 Gy
Reference Point Dosage Given to Date: 45 Gy
Reference Point Session Dosage Given: 1.8 Gy
Session Number: 25

## 2024-09-13 NOTE — Progress Notes (Signed)
 Patient was a RadOnc Consult on 02/27/24 for his stage T1c adenocarcinoma of the prostate with Gleason score of 4+4, and PSA of 9.2. Patient proceed with treatment recommendations of brachytherapy boost and use of SpaceOAR gel, followed by a 5 week course of daily radiotherapy +/- SBRT to the osseous lesion, concurrent with LT-ADT and had his final radiation treatment on 09/13/24.   Patient is scheduled for a post treatment nurse call on 10/16/24 and has active follow up's with urology, next appointment 10/11/24.

## 2024-09-14 NOTE — Radiation Completion Notes (Addendum)
  Radiation Oncology         [phone] ________________________________  Name: Joseph Cox MRN: [id number]  Date: 09/13/2024  DOB: 08-Nov-1956  Referring Physician: RICARDO LIKENS, M.D. Date of Service: 2024-09-14 Radiation Oncologist: Adina Barge, M.D. [facility]      RADIATION ONCOLOGY END OF TREATMENT NOTE     Diagnosis:  68 y.o. 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   Intent: Curative     ==========DELIVERED PLANS==========  First Treatment Date: 2024-08-08 Last Treatment Date: 2024-09-13   Plan Name: Prostate_Pelv Site: Prostate Technique: IMRT Mode: Photon Dose Per Fraction: 1.8 Gy Prescribed Dose (Delivered / Prescribed): 45 Gy / 45 Gy Prescribed Fxs (Delivered / Prescribed): 25 / 25   Plan Name: Prostate Seed Implant Site: Prostate Technique: Radioactive Seed Implant I-125 Mode: Brachytherapy Dose Per Fraction: 110 Gy Prescribed Dose (Delivered / Prescribed): 110 Gy / 110 Gy Prescribed Fxs (Delivered / Prescribed): 1 / 1     ==========ON TREATMENT VISIT DATES========== 2024-08-10, 2024-08-17, 2024-08-23, 2024-08-31, 2024-09-11   See weekly On Treatment Notes in Epic for details in the Media tab (listed as Progress notes on the On Treatment Visit Dates listed above).  He tolerated the daily radiation treatments relatively well with modest fatigue, increased LUTS and occasional loose stools.  The patient will receive a call in about one month from the radiation oncology department. He will continue follow up with his urologist, Dr. LIKENS, as well.  ------------------------------------------------   Donnice Barge, MD [facility]  Radiation Oncology Direct Dial: [phone]  Fax: [phone] [email or web address]  Skype  LinkedIn

## 2024-10-03 NOTE — Progress Notes (Signed)
  Radiation Oncology         [phone] ________________________________  Name: Joseph Cox MRN: [id number]  Date of Service: 10/16/2024  DOB: 28-Oct-1956  Post Treatment Telephone Note  Diagnosis:  Malignant neoplasm of prostate  Pre Treatment IPSS Score: 6 (as documented in the provider consult note)  The patient was available for call today.   Symptoms of fatigue have improved since completing therapy.  Symptoms of bladder changes have improved since completing therapy. Current symptoms include frequency and nocturia, and medications for bladder symptoms include Flomax .  Symptoms of bowel changes have improved since completing therapy. Current symptoms include some occasional constipation. He is eating and drinking well.  Post Treatment IPSS Score: 12  IPSS Questionnaire (AUA-7): Over the past month.   1)  How often have you had a sensation of not emptying your bladder completely after you finish urinating?  0  2)  How often have you had to urinate again less than two hours after you finished urinating? 3 - About half the time  3)  How often have you found you stopped and started again several times when you urinated?  0 - Not at all  4) How difficult have you found it to postpone urination?  2 - Less than half the time  5) How often have you had a weak urinary stream?  2 - Less than half the time  6) How often have you had to push or strain to begin urination?  0 - Not at all  7) How many times did you most typically get up to urinate from the time you went to bed until the time you got up in the morning?  5 - 5+ times  Total score:  12. Which indicates moderate symptoms  0-7 mildly symptomatic   8-19 moderately symptomatic   20-35 severely symptomatic   Patient has a scheduled follow up visit with his urologist, Dr. Ricardo Likens, on 10/18/24 for ongoing surveillance. He was counseled that PSA levels will be drawn in the urology office, and was reassured that additional  time is expected to improve bowel and bladder symptoms. He was encouraged to call back with concerns or questions regarding radiation.

## 2024-10-05 ENCOUNTER — Other Ambulatory Visit: Payer: Self-pay | Admitting: Urology

## 2024-10-05 DIAGNOSIS — C61 Malignant neoplasm of prostate: Secondary | ICD-10-CM

## 2024-10-16 ENCOUNTER — Ambulatory Visit
Admission: RE | Admit: 2024-10-16 | Discharge: 2024-10-16 | Disposition: A | Discharge status: Home / Self Care | Source: Ambulatory Visit | Attending: Radiation Oncology | Admitting: Radiation Oncology

## 2024-10-16 DIAGNOSIS — C61 Malignant neoplasm of prostate: Secondary | ICD-10-CM

## 2024-10-18 DIAGNOSIS — R232 Flushing: Secondary | ICD-10-CM | POA: Diagnosis not present

## 2024-10-18 DIAGNOSIS — C61 Malignant neoplasm of prostate: Secondary | ICD-10-CM | POA: Diagnosis not present

## 2024-10-18 DIAGNOSIS — R399 Unspecified symptoms and signs involving the genitourinary system: Secondary | ICD-10-CM | POA: Diagnosis not present

## 2024-10-18 NOTE — Progress Notes (Addendum)
 Joseph Cox                                          MRN: [id number]   12/28/2024   The [facility] Quality Team Specialist reviewed this patient medical record for the purposes of chart review for care gap closure. The following were reviewed: chart review for care gap closure-diabetic eye exam.    [facility] Quality Team

## 2024-12-24 ENCOUNTER — Encounter: Payer: Self-pay | Admitting: *Deleted

## 2025-02-19 ENCOUNTER — Inpatient Hospital Stay: Admitting: *Deleted
